# Patient Record
Sex: Male | Born: 1960 | Race: Black or African American | Hispanic: No | Marital: Single | Smoking: Current some day smoker
Health system: Southern US, Community
[De-identification: ages and names within clinical notes are randomized; demographics above are authoritative.]

## PROBLEM LIST (undated history)

## (undated) DIAGNOSIS — Z789 Other specified health status: Secondary | ICD-10-CM

---

## 1999-08-03 ENCOUNTER — Encounter: Payer: Self-pay | Admitting: Emergency Medicine

## 1999-08-03 ENCOUNTER — Emergency Department (HOSPITAL_COMMUNITY): Admission: EM | Admit: 1999-08-03 | Discharge: 1999-08-03 | Payer: Self-pay | Admitting: Emergency Medicine

## 1999-10-16 ENCOUNTER — Emergency Department (HOSPITAL_COMMUNITY): Admission: EM | Admit: 1999-10-16 | Discharge: 1999-10-16 | Payer: Self-pay | Admitting: Emergency Medicine

## 2005-05-26 ENCOUNTER — Emergency Department (HOSPITAL_COMMUNITY): Admission: EM | Admit: 2005-05-26 | Discharge: 2005-05-26 | Payer: Self-pay | Admitting: Family Medicine

## 2009-11-24 ENCOUNTER — Emergency Department (HOSPITAL_COMMUNITY): Admission: EM | Admit: 2009-11-24 | Discharge: 2009-11-24 | Payer: Self-pay | Admitting: Emergency Medicine

## 2009-12-18 ENCOUNTER — Emergency Department (HOSPITAL_COMMUNITY): Admission: EM | Admit: 2009-12-18 | Discharge: 2009-12-18 | Payer: Self-pay | Admitting: Emergency Medicine

## 2010-06-09 LAB — GC/CHLAMYDIA PROBE AMP, GENITAL
Chlamydia, DNA Probe: NEGATIVE
GC Probe Amp, Genital: POSITIVE — AB

## 2010-06-10 LAB — GONOCOCCUS CULTURE: Culture: NO GROWTH

## 2010-06-10 LAB — GRAM STAIN: Gram Stain: NONE SEEN

## 2010-06-10 LAB — EYE CULTURE

## 2011-08-16 ENCOUNTER — Encounter (HOSPITAL_COMMUNITY): Payer: Self-pay | Admitting: Certified Registered"

## 2011-08-16 ENCOUNTER — Inpatient Hospital Stay (HOSPITAL_COMMUNITY)
Admission: EM | Admit: 2011-08-16 | Discharge: 2011-08-22 | DRG: 492 | Disposition: A | Discharge status: Rehabilitation Facility | Payer: No Typology Code available for payment source | Attending: General Surgery | Admitting: General Surgery

## 2011-08-16 ENCOUNTER — Emergency Department (HOSPITAL_COMMUNITY): Payer: No Typology Code available for payment source

## 2011-08-16 ENCOUNTER — Inpatient Hospital Stay (HOSPITAL_COMMUNITY): Payer: No Typology Code available for payment source | Admitting: Certified Registered"

## 2011-08-16 ENCOUNTER — Encounter (HOSPITAL_COMMUNITY): Admission: EM | Disposition: A | Discharge status: Rehabilitation Facility | Payer: Self-pay | Source: Home / Self Care

## 2011-08-16 ENCOUNTER — Inpatient Hospital Stay (HOSPITAL_COMMUNITY): Payer: No Typology Code available for payment source

## 2011-08-16 DIAGNOSIS — S3130XA Unspecified open wound of scrotum and testes, initial encounter: Secondary | ICD-10-CM | POA: Diagnosis present

## 2011-08-16 DIAGNOSIS — Y998 Other external cause status: Secondary | ICD-10-CM

## 2011-08-16 DIAGNOSIS — F1210 Cannabis abuse, uncomplicated: Secondary | ICD-10-CM | POA: Diagnosis present

## 2011-08-16 DIAGNOSIS — S82409A Unspecified fracture of shaft of unspecified fibula, initial encounter for closed fracture: Secondary | ICD-10-CM

## 2011-08-16 DIAGNOSIS — S3121XA Laceration without foreign body of penis, initial encounter: Secondary | ICD-10-CM | POA: Diagnosis present

## 2011-08-16 DIAGNOSIS — Y92410 Unspecified street and highway as the place of occurrence of the external cause: Secondary | ICD-10-CM

## 2011-08-16 DIAGNOSIS — S3120XA Unspecified open wound of penis, initial encounter: Secondary | ICD-10-CM | POA: Diagnosis present

## 2011-08-16 DIAGNOSIS — F17200 Nicotine dependence, unspecified, uncomplicated: Secondary | ICD-10-CM | POA: Diagnosis present

## 2011-08-16 DIAGNOSIS — D62 Acute posthemorrhagic anemia: Secondary | ICD-10-CM | POA: Diagnosis not present

## 2011-08-16 DIAGNOSIS — S32599A Other specified fracture of unspecified pubis, initial encounter for closed fracture: Secondary | ICD-10-CM | POA: Diagnosis present

## 2011-08-16 DIAGNOSIS — S060XAA Concussion with loss of consciousness status unknown, initial encounter: Secondary | ICD-10-CM | POA: Diagnosis present

## 2011-08-16 DIAGNOSIS — R0602 Shortness of breath: Secondary | ICD-10-CM | POA: Diagnosis present

## 2011-08-16 DIAGNOSIS — S8253XA Displaced fracture of medial malleolus of unspecified tibia, initial encounter for closed fracture: Principal | ICD-10-CM | POA: Diagnosis present

## 2011-08-16 DIAGNOSIS — S32509B Unspecified fracture of unspecified pubis, initial encounter for open fracture: Secondary | ICD-10-CM | POA: Diagnosis present

## 2011-08-16 DIAGNOSIS — S82209A Unspecified fracture of shaft of unspecified tibia, initial encounter for closed fracture: Secondary | ICD-10-CM | POA: Diagnosis present

## 2011-08-16 DIAGNOSIS — S82201A Unspecified fracture of shaft of right tibia, initial encounter for closed fracture: Secondary | ICD-10-CM | POA: Diagnosis present

## 2011-08-16 DIAGNOSIS — S3131XA Laceration without foreign body of scrotum and testes, initial encounter: Secondary | ICD-10-CM | POA: Diagnosis present

## 2011-08-16 DIAGNOSIS — S82832A Other fracture of upper and lower end of left fibula, initial encounter for closed fracture: Secondary | ICD-10-CM | POA: Diagnosis present

## 2011-08-16 HISTORY — PX: INCISION AND DRAINAGE OF WOUND: SHX1803

## 2011-08-16 HISTORY — PX: ORIF ANKLE FRACTURE: SHX5408

## 2011-08-16 HISTORY — DX: Other specified health status: Z78.9

## 2011-08-16 HISTORY — PX: TIBIA IM NAIL INSERTION: SHX2516

## 2011-08-16 HISTORY — PX: CYSTOSCOPY: SHX5120

## 2011-08-16 HISTORY — PX: SCROTAL EXPLORATION: SHX2386

## 2011-08-16 HISTORY — PX: LACERATION REPAIR: SHX5284

## 2011-08-16 LAB — ABO/RH: ABO/RH(D): O POS

## 2011-08-16 LAB — POCT I-STAT, CHEM 8
Calcium, Ion: 1.12 mmol/L (ref 1.12–1.32)
Chloride: 107 mEq/L (ref 96–112)
HCT: 40 % (ref 39.0–52.0)
Hemoglobin: 13.6 g/dL (ref 13.0–17.0)

## 2011-08-16 LAB — COMPREHENSIVE METABOLIC PANEL
ALT: 28 U/L (ref 0–53)
AST: 38 U/L — ABNORMAL HIGH (ref 0–37)
Alkaline Phosphatase: 63 U/L (ref 39–117)
CO2: 20 mEq/L (ref 19–32)
Chloride: 100 mEq/L (ref 96–112)
GFR calc non Af Amer: 73 mL/min — ABNORMAL LOW (ref >90)
Sodium: 136 mEq/L (ref 135–145)
Total Bilirubin: 0.4 mg/dL (ref 0.3–1.2)

## 2011-08-16 LAB — CDS SEROLOGY

## 2011-08-16 LAB — CBC
MCH: 28.4 pg (ref 26.0–34.0)
MCHC: 33.7 g/dL (ref 30.0–36.0)
Platelets: 221 10*3/uL (ref 150–400)

## 2011-08-16 LAB — PROTIME-INR: INR: 1.03 (ref 0.00–1.49)

## 2011-08-16 LAB — ETHANOL: Alcohol, Ethyl (B): <11 mg/dL (ref 0–11)

## 2011-08-16 SURGERY — INSERTION, INTRAMEDULLARY ROD, TIBIA
Anesthesia: General | Site: Penis | Laterality: Right | Wound class: Clean Contaminated

## 2011-08-16 MED ORDER — MIDAZOLAM HCL 5 MG/5ML IJ SOLN
INTRAMUSCULAR | Status: DC | PRN
  Administered 2011-08-16: 2 mg via INTRAVENOUS

## 2011-08-16 MED ORDER — EPHEDRINE SULFATE 50 MG/ML IJ SOLN
INTRAMUSCULAR | Status: DC | PRN
  Administered 2011-08-16: 5 mg via INTRAVENOUS
  Administered 2011-08-16 (×2): 10 mg via INTRAVENOUS
  Administered 2011-08-16: 15 mg via INTRAVENOUS

## 2011-08-16 MED ORDER — ROCURONIUM BROMIDE 100 MG/10ML IV SOLN
INTRAVENOUS | Status: DC | PRN
  Administered 2011-08-16: 20 mg via INTRAVENOUS
  Administered 2011-08-16: 30 mg via INTRAVENOUS

## 2011-08-16 MED ORDER — LIDOCAINE HCL (CARDIAC) 20 MG/ML IV SOLN
INTRAVENOUS | Status: DC | PRN
  Administered 2011-08-16: 30 mg via INTRAVENOUS

## 2011-08-16 MED ORDER — SODIUM CHLORIDE 0.9 % IR SOLN
Status: DC | PRN
  Administered 2011-08-16: 22:00:00

## 2011-08-16 MED ORDER — SODIUM CHLORIDE 0.9 % IR SOLN
Status: DC | PRN
  Administered 2011-08-16: 1000 mL

## 2011-08-16 MED ORDER — SODIUM CHLORIDE 0.9 % IV SOLN
INTRAVENOUS | Status: DC | PRN
  Administered 2011-08-16: 23:00:00 via INTRAVENOUS

## 2011-08-16 MED ORDER — SUCCINYLCHOLINE CHLORIDE 20 MG/ML IJ SOLN
INTRAMUSCULAR | Status: DC | PRN
  Administered 2011-08-16: 160 mg via INTRAVENOUS

## 2011-08-16 MED ORDER — TETANUS-DIPHTH-ACELL PERTUSSIS 5-2.5-18.5 LF-MCG/0.5 IM SUSP
0.5000 mL | Freq: Once | INTRAMUSCULAR | Status: AC
  Administered 2011-08-16: 0.5 mL via INTRAMUSCULAR

## 2011-08-16 MED ORDER — CEFAZOLIN SODIUM-DEXTROSE 2-3 GM-% IV SOLR
2.0000 g | Freq: Once | INTRAVENOUS | Status: AC
  Administered 2011-08-16: 2 g via INTRAVENOUS
  Filled 2011-08-16: qty 50

## 2011-08-16 MED ORDER — TETANUS-DIPHTHERIA TOXOIDS TD 5-2 LFU IM INJ: 0.5000 mL | INJECTION | Freq: Once | INTRAMUSCULAR | Status: DC

## 2011-08-16 MED ORDER — FENTANYL CITRATE 0.05 MG/ML IJ SOLN
INTRAMUSCULAR | Status: DC | PRN
  Administered 2011-08-16 (×2): 100 ug via INTRAVENOUS
  Administered 2011-08-16: 50 ug via INTRAVENOUS

## 2011-08-16 MED ORDER — PHENYLEPHRINE HCL 10 MG/ML IJ SOLN
INTRAMUSCULAR | Status: DC | PRN
  Administered 2011-08-16: 120 ug via INTRAVENOUS
  Administered 2011-08-16: 80 ug via INTRAVENOUS
  Administered 2011-08-16: 120 ug via INTRAVENOUS
  Administered 2011-08-16 (×2): 40 ug via INTRAVENOUS

## 2011-08-16 MED ORDER — IOHEXOL 300 MG/ML  SOLN
100.0000 mL | Freq: Once | INTRAMUSCULAR | Status: AC | PRN
  Administered 2011-08-16: 100 mL via INTRAVENOUS

## 2011-08-16 MED ORDER — MORPHINE SULFATE 4 MG/ML IJ SOLN
4.0000 mg | Freq: Once | INTRAMUSCULAR | Status: AC
  Administered 2011-08-16: 4 mg via INTRAVENOUS
  Filled 2011-08-16: qty 1

## 2011-08-16 MED ORDER — TETANUS-DIPHTH-ACELL PERTUSSIS 5-2.5-18.5 LF-MCG/0.5 IM SUSP
INTRAMUSCULAR | Status: AC
  Filled 2011-08-16: qty 0.5

## 2011-08-16 MED ORDER — ONDANSETRON HCL 4 MG/2ML IJ SOLN
4.0000 mg | Freq: Once | INTRAMUSCULAR | Status: AC
  Administered 2011-08-16: 4 mg via INTRAVENOUS
  Filled 2011-08-16: qty 2

## 2011-08-16 MED ORDER — SODIUM CHLORIDE 0.9 % IV BOLUS (SEPSIS)
1000.0000 mL | INTRAVENOUS | Status: AC
  Administered 2011-08-16 (×3): 1000 mL via INTRAVENOUS

## 2011-08-16 MED ORDER — PROPOFOL 10 MG/ML IV EMUL
INTRAVENOUS | Status: DC | PRN
  Administered 2011-08-16: 110 mg via INTRAVENOUS

## 2011-08-16 MED ORDER — SODIUM CHLORIDE 0.9 % IR SOLN
Status: DC | PRN
  Administered 2011-08-16: 3000 mL

## 2011-08-16 MED ORDER — MORPHINE SULFATE 4 MG/ML IJ SOLN
INTRAMUSCULAR | Status: AC
  Administered 2011-08-16: 4 mg
  Filled 2011-08-16: qty 1

## 2011-08-16 MED ORDER — LACTATED RINGERS IV SOLN
INTRAVENOUS | Status: DC | PRN
  Administered 2011-08-16 (×3): via INTRAVENOUS

## 2011-08-16 SURGICAL SUPPLY — 110 items
ADAPTER CATH URET PLST 4-6FR (CATHETERS) IMPLANT
APL SKNCLS STERI-STRIP NONHPOA (GAUZE/BANDAGES/DRESSINGS)
BAG DECANTER FOR FLEXI CONT (MISCELLANEOUS) ×6 IMPLANT
BAG URINE DRAINAGE (UROLOGICAL SUPPLIES) ×6 IMPLANT
BAG URO CATCHER STRL LF (DRAPE) ×6 IMPLANT
BANDAGE ELASTIC 6 VELCRO ST LF (GAUZE/BANDAGES/DRESSINGS) ×6 IMPLANT
BANDAGE ESMARK 6X9 LF (GAUZE/BANDAGES/DRESSINGS) IMPLANT
BANDAGE GAUZE ELAST BULKY 4 IN (GAUZE/BANDAGES/DRESSINGS) ×18 IMPLANT
BENZOIN TINCTURE PRP APPL 2/3 (GAUZE/BANDAGES/DRESSINGS) IMPLANT
BIT DRILL 2.5X2.75 QC CALB (BIT) ×6 IMPLANT
BIT DRILL 3.8X8 NS (BIT) ×6 IMPLANT
BIT DRILL 4.4 NS (BIT) ×6 IMPLANT
BLADE SURG 10 STRL SS (BLADE) ×12 IMPLANT
BLADE SURG 15 STRL LF DISP TIS (BLADE) ×5 IMPLANT
BLADE SURG 15 STRL SS (BLADE) ×3
BLADE SURG ROTATE 9660 (MISCELLANEOUS) IMPLANT
BNDG CMPR 9X6 STRL LF SNTH (GAUZE/BANDAGES/DRESSINGS)
BNDG COHESIVE 4X5 TAN STRL (GAUZE/BANDAGES/DRESSINGS) ×6 IMPLANT
BNDG ESMARK 6X9 LF (GAUZE/BANDAGES/DRESSINGS)
BRUSH SCRUB DISP (MISCELLANEOUS) ×12 IMPLANT
BUCKET BIOHAZARD WASTE 5 GAL (MISCELLANEOUS) ×6 IMPLANT
CATH FOLEY 2WAY SLVR  5CC 16FR (CATHETERS) ×1
CATH FOLEY 2WAY SLVR 5CC 16FR (CATHETERS) ×5 IMPLANT
CATH URET 5FR 28IN CONE TIP (BALLOONS)
CATH URET 5FR 70CM CONE TIP (BALLOONS) IMPLANT
CHLORAPREP W/TINT 26ML (MISCELLANEOUS) ×6 IMPLANT
CLOTH BEACON ORANGE TIMEOUT ST (SAFETY) ×12 IMPLANT
COVER SURGICAL LIGHT HANDLE (MISCELLANEOUS) ×24 IMPLANT
DRAPE C-ARM 42X72 X-RAY (DRAPES) ×12 IMPLANT
DRAPE C-ARMOR (DRAPES) ×6 IMPLANT
DRAPE CAMERA CLOSED 9X96 (DRAPES) ×12 IMPLANT
DRAPE INCISE IOBAN 66X45 STRL (DRAPES) ×6 IMPLANT
DRAPE LAPAROTOMY T 102X78X121 (DRAPES) IMPLANT
DRAPE ORTHO SPLIT 77X108 STRL (DRAPES) ×4
DRAPE PROXIMA HALF (DRAPES) IMPLANT
DRAPE SURG ORHT 6 SPLT 77X108 (DRAPES) ×10 IMPLANT
DRAPE U-SHAPE 47X51 STRL (DRAPES) ×6 IMPLANT
DRSG ADAPTIC 3X8 NADH LF (GAUZE/BANDAGES/DRESSINGS) ×6 IMPLANT
DRSG EMULSION OIL 3X3 NADH (GAUZE/BANDAGES/DRESSINGS) ×6 IMPLANT
DRSG MEPILEX BORDER 4X4 (GAUZE/BANDAGES/DRESSINGS) ×6 IMPLANT
ELECT CAUTERY BLADE 6.4 (BLADE) ×6 IMPLANT
ELECT REM PT RETURN 9FT ADLT (ELECTROSURGICAL) ×12
ELECTRODE REM PT RTRN 9FT ADLT (ELECTROSURGICAL) ×10 IMPLANT
GAUZE SPONGE 4X4 16PLY XRAY LF (GAUZE/BANDAGES/DRESSINGS) ×6 IMPLANT
GAUZE XEROFORM 5X9 LF (GAUZE/BANDAGES/DRESSINGS) ×6 IMPLANT
GLOVE BIO SURGEON STRL SZ7 (GLOVE) ×6 IMPLANT
GLOVE BIO SURGEON STRL SZ7.5 (GLOVE) ×6 IMPLANT
GLOVE BIOGEL M STRL SZ7.5 (GLOVE) ×6 IMPLANT
GLOVE BIOGEL PI IND STRL 8 (GLOVE) ×5 IMPLANT
GLOVE BIOGEL PI INDICATOR 8 (GLOVE) ×1
GOWN PREVENTION PLUS LG XLONG (DISPOSABLE) ×6 IMPLANT
GOWN STRL NON-REIN LRG LVL3 (GOWN DISPOSABLE) ×36 IMPLANT
GUIDEWIRE BALL NOSE 80CM (WIRE) ×6 IMPLANT
GUIDEWIRE COOK  .035 (WIRE) IMPLANT
H R LUBE JELLY XXX (MISCELLANEOUS) ×6 IMPLANT
HANDPIECE INTERPULSE COAX TIP (DISPOSABLE)
KIT BASIN OR (CUSTOM PROCEDURE TRAY) ×12 IMPLANT
KIT ROOM TURNOVER OR (KITS) ×12 IMPLANT
LEGGING LITHOTOMY PAIR STRL (DRAPES) IMPLANT
NAIL TIBIAL 11MM X 37.5CM (Nail) ×5 IMPLANT
NEEDLE HYPO 25X1 1.5 SAFETY (NEEDLE) IMPLANT
NS IRRIG 1000ML POUR BTL (IV SOLUTION) ×6 IMPLANT
PACK CYSTOSCOPY (CUSTOM PROCEDURE TRAY) ×6 IMPLANT
PACK ORTHO EXTREMITY (CUSTOM PROCEDURE TRAY) ×6 IMPLANT
PACK SURGICAL SETUP 50X90 (CUSTOM PROCEDURE TRAY) ×6 IMPLANT
PAD ARMBOARD 7.5X6 YLW CONV (MISCELLANEOUS) ×24 IMPLANT
PADDING CAST ABS 6INX4YD NS (CAST SUPPLIES) ×1
PADDING CAST ABS COTTON 6X4 NS (CAST SUPPLIES) ×5 IMPLANT
PENCIL BUTTON HOLSTER BLD 10FT (ELECTRODE) ×6 IMPLANT
PIN GUIDE ACE (PIN) ×6 IMPLANT
PLATE ACE 100DEG 6HOLE (Plate) ×6 IMPLANT
PLUG CATH AND CAP STER (CATHETERS) IMPLANT
SCREW ACECAP 44MM (Screw) ×6 IMPLANT
SCREW CORTICAL 3.5MM  16MM (Screw) ×3 IMPLANT
SCREW CORTICAL 3.5MM 14MM (Screw) ×6 IMPLANT
SCREW CORTICAL 3.5MM 16MM (Screw) ×15 IMPLANT
SCREW CORTICAL 3.5MM 18MM (Screw) ×6 IMPLANT
SCREW CORTICAL 3.5MM 24MM (Screw) ×6 IMPLANT
SCREW PROXIMAL DEPUY (Screw) ×4 IMPLANT
SCREW PRXML FT 55X5.5XNS TIB (Screw) ×5 IMPLANT
SET HNDPC FAN SPRY TIP SCT (DISPOSABLE) IMPLANT
SPLINT PLASTER CAST XFAST 5X30 (CAST SUPPLIES) ×5 IMPLANT
SPLINT PLASTER XFAST SET 5X30 (CAST SUPPLIES) ×1
SPONGE GAUZE 4X4 12PLY (GAUZE/BANDAGES/DRESSINGS) ×18 IMPLANT
SPONGE LAP 4X18 X RAY DECT (DISPOSABLE) IMPLANT
STAPLER VISISTAT (STAPLE) ×6 IMPLANT
STAPLER VISISTAT 35W (STAPLE) ×6 IMPLANT
STENT URET 6FRX24 CONTOUR (STENTS) IMPLANT
SUT CHROMIC 3 0 PS 2 (SUTURE) IMPLANT
SUT CHROMIC 3 0 SH 27 (SUTURE) ×12 IMPLANT
SUT CHROMIC 4 0 RB 1X27 (SUTURE) ×12 IMPLANT
SUT PROLENE 3 0 PS 2 (SUTURE) IMPLANT
SUT VIC AB 0 CT1 27 (SUTURE)
SUT VIC AB 0 CT1 27XBRD ANBCTR (SUTURE) IMPLANT
SUT VIC AB 2-0 CT1 27 (SUTURE) ×8
SUT VIC AB 2-0 CT1 TAPERPNT 27 (SUTURE) ×10 IMPLANT
SUT VIC AB 2-0 CT3 27 (SUTURE) IMPLANT
SUT VIC AB 2-0 FS1 27 (SUTURE) ×6 IMPLANT
SUT VIC AB 3-0 SH 18 (SUTURE) ×6 IMPLANT
SYR CONTROL 10ML LL (SYRINGE) ×6 IMPLANT
SYRINGE TOOMEY DISP (SYRINGE) IMPLANT
TIBIAL NAIL 11MM X 37.5CM (Nail) ×6 IMPLANT
TOWEL OR 17X24 6PK STRL BLUE (TOWEL DISPOSABLE) ×12 IMPLANT
TOWEL OR 17X26 10 PK STRL BLUE (TOWEL DISPOSABLE) ×12 IMPLANT
TRAY FOLEY CATH 14FR (SET/KITS/TRAYS/PACK) ×6 IMPLANT
TUBE CONNECTING 12X1/4 (SUCTIONS) ×12 IMPLANT
UNDERPAD 30X30 INCONTINENT (UNDERPADS AND DIAPERS) ×6 IMPLANT
WATER STERILE IRR 1000ML POUR (IV SOLUTION) ×6 IMPLANT
WIRE COONS/BENSON .038X145CM (WIRE) IMPLANT
YANKAUER SUCT BULB TIP NO VENT (SUCTIONS) ×12 IMPLANT

## 2011-08-16 NOTE — ED Notes (Signed)
While in CT pt having lg amount of bleeding coming from penis and scrotum sac.  Dr. Radford Pax called to CT.  Quick Clot applied to site.   Also c/o point tenderness to spine.
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
## 2011-08-16 NOTE — ED Notes (Signed)
Spoke with pt's brother, st's he will come to hospital.
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
## 2011-08-16 NOTE — ED Notes (Signed)
Urologist in to assess pt.

## 2011-08-16 NOTE — Preoperative (Addendum)
Beta Blockers   Reason not to administer Beta Blockers:Not Applicable 
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
## 2011-08-16 NOTE — ED Notes (Signed)
Pt continues to bleed from scrotum, MD made aware.  OR called st's ready for pt.
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
## 2011-08-16 NOTE — Progress Notes (Signed)
Orthopedic Tech Progress Note Patient Details:  Xavier Matthews [birth year] [id number]  Type of Splint: Short Leg;Stirrup Splint Location: (R) LE Splint Interventions: Application    Jennye Moccasin 08/16/2011, 8:11 PM
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
## 2011-08-16 NOTE — ED Notes (Signed)
To CT via stretcher with monitor.
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
## 2011-08-16 NOTE — Anesthesia Preprocedure Evaluation (Addendum)
Anesthesia Evaluation  Patient identified by MRN, date of birth, ID band Patient confused    Reviewed: Allergy & Precautions, H&P , NPO status , Patient's Chart, lab work & pertinent test results, Unable to perform ROS - Chart review only  Airway Mallampati: II TM Distance: >3 FB Neck ROM: Limited    Dental  (+) Poor Dentition, Dental Advisory Given and Teeth Intact   Pulmonary Current Smoker,  breath sounds clear to auscultation  Pulmonary exam normal       Cardiovascular Rhythm:Regular Rate:Normal  Hypotensive in ED, received fluid bolus   Neuro/Psych  C-spine not cleared negative psych ROS   GI/Hepatic negative GI ROS, Neg liver ROS,   Endo/Other  negative endocrine ROS  Renal/GU negative Renal ROS   Genitalia injury tonight, probable arterial injury    Musculoskeletal   Abdominal   Peds  Hematology negative hematology ROS (+)   Anesthesia Other Findings   Reproductive/Obstetrics                          Anesthesia Physical Anesthesia Plan  ASA: II and Emergent  Anesthesia Plan: General   Post-op Pain Management:    Induction: Intravenous and Rapid sequence  Airway Management Planned: Oral ETT and Video Laryngoscope Planned  Additional Equipment:   Intra-op Plan:   Post-operative Plan: Extubation in OR  Informed Consent: I have reviewed the patients History and Physical, chart, labs and discussed the procedure including the risks, benefits and alternatives for the proposed anesthesia with the patient or authorized representative who has indicated his/her understanding and acceptance.   Dental advisory given and Only emergency history available  Plan Discussed with: Surgeon and CRNA  Anesthesia Plan Comments: (Plan routine monitors, RSI GETA with C-collar in place)        Anesthesia Quick Evaluation

## 2011-08-17 ENCOUNTER — Inpatient Hospital Stay (HOSPITAL_COMMUNITY): Payer: No Typology Code available for payment source

## 2011-08-17 ENCOUNTER — Encounter (HOSPITAL_COMMUNITY): Payer: Self-pay | Admitting: Orthopedic Surgery

## 2011-08-17 DIAGNOSIS — S3121XA Laceration without foreign body of penis, initial encounter: Secondary | ICD-10-CM | POA: Diagnosis present

## 2011-08-17 DIAGNOSIS — D62 Acute posthemorrhagic anemia: Secondary | ICD-10-CM | POA: Diagnosis not present

## 2011-08-17 DIAGNOSIS — S82832A Other fracture of upper and lower end of left fibula, initial encounter for closed fracture: Secondary | ICD-10-CM | POA: Diagnosis present

## 2011-08-17 DIAGNOSIS — S32599A Other specified fracture of unspecified pubis, initial encounter for closed fracture: Secondary | ICD-10-CM | POA: Diagnosis present

## 2011-08-17 DIAGNOSIS — S82201A Unspecified fracture of shaft of right tibia, initial encounter for closed fracture: Secondary | ICD-10-CM | POA: Diagnosis present

## 2011-08-17 DIAGNOSIS — S060XAA Concussion with loss of consciousness status unknown, initial encounter: Secondary | ICD-10-CM | POA: Diagnosis present

## 2011-08-17 DIAGNOSIS — S3131XA Laceration without foreign body of scrotum and testes, initial encounter: Secondary | ICD-10-CM | POA: Diagnosis present

## 2011-08-17 LAB — POCT I-STAT 7, (LYTES, BLD GAS, ICA,H+H)
Acid-base deficit: 5 mmol/L — ABNORMAL HIGH (ref 0.0–2.0)
Bicarbonate: 21.6 meq/L (ref 20.0–24.0)
HCT: 27 % — ABNORMAL LOW (ref 39.0–52.0)
Patient temperature: 35.7
pCO2 arterial: 44.7 mmHg (ref 35.0–45.0)
pO2, Arterial: 522 mmHg — ABNORMAL HIGH (ref 80.0–100.0)

## 2011-08-17 LAB — BASIC METABOLIC PANEL
Calcium: 7.6 mg/dL — ABNORMAL LOW (ref 8.4–10.5)
GFR calc Af Amer: >90 mL/min (ref >90)
GFR calc non Af Amer: >90 mL/min (ref >90)
Glucose, Bld: 116 mg/dL — ABNORMAL HIGH (ref 70–99)
Potassium: 3.6 mEq/L (ref 3.5–5.1)
Sodium: 140 mEq/L (ref 135–145)

## 2011-08-17 LAB — URINALYSIS, MICROSCOPIC ONLY
Glucose, UA: NEGATIVE mg/dL
Ketones, ur: NEGATIVE mg/dL
Leukocytes, UA: NEGATIVE
Nitrite: NEGATIVE
Protein, ur: NEGATIVE mg/dL

## 2011-08-17 LAB — POCT I-STAT 4, (NA,K, GLUC, HGB,HCT)
Hemoglobin: 7.8 g/dL — ABNORMAL LOW (ref 13.0–17.0)
Sodium: 141 meq/L (ref 135–145)

## 2011-08-17 LAB — HEMOGLOBIN AND HEMATOCRIT, BLOOD: HCT: 32.7 % — ABNORMAL LOW (ref 39.0–52.0)

## 2011-08-17 LAB — CBC
Hemoglobin: 11.3 g/dL — ABNORMAL LOW (ref 13.0–17.0)
MCHC: 33.6 g/dL (ref 30.0–36.0)
Platelets: 116 10*3/uL — ABNORMAL LOW (ref 150–400)
RDW: 13.8 % (ref 11.5–15.5)

## 2011-08-17 LAB — PROTIME-INR
INR: 1.2 (ref 0.00–1.49)
Prothrombin Time: 15.5 seconds — ABNORMAL HIGH (ref 11.6–15.2)

## 2011-08-17 LAB — MRSA PCR SCREENING: MRSA by PCR: NEGATIVE

## 2011-08-17 MED ORDER — GLYCOPYRROLATE 0.2 MG/ML IJ SOLN
INTRAMUSCULAR | Status: DC | PRN
  Administered 2011-08-16: 0.4 mg via INTRAVENOUS

## 2011-08-17 MED ORDER — NEOSTIGMINE METHYLSULFATE 1 MG/ML IJ SOLN
INTRAMUSCULAR | Status: DC | PRN
  Administered 2011-08-16: 3 mg via INTRAVENOUS

## 2011-08-17 MED ORDER — ONDANSETRON HCL 4 MG/2ML IJ SOLN: 4.0000 mg | Freq: Four times a day (QID) | INTRAMUSCULAR | Status: DC | PRN

## 2011-08-17 MED ORDER — MEPERIDINE HCL 25 MG/ML IJ SOLN: 6.2500 mg | INTRAMUSCULAR | Status: DC | PRN

## 2011-08-17 MED ORDER — ENOXAPARIN SODIUM 30 MG/0.3ML ~~LOC~~ SOLN
30.0000 mg | Freq: Two times a day (BID) | SUBCUTANEOUS | Status: DC
  Administered 2011-08-17 – 2011-08-22 (×11): 30 mg via SUBCUTANEOUS
  Filled 2011-08-17 (×14): qty 0.3

## 2011-08-17 MED ORDER — KCL IN DEXTROSE-NACL 20-5-0.45 MEQ/L-%-% IV SOLN
INTRAVENOUS | Status: DC
  Administered 2011-08-17 – 2011-08-18 (×3): via INTRAVENOUS
  Filled 2011-08-17 (×6): qty 1000

## 2011-08-17 MED ORDER — HYDROMORPHONE HCL PF 1 MG/ML IJ SOLN
0.2500 mg | INTRAMUSCULAR | Status: DC | PRN
  Administered 2011-08-17 (×4): 0.5 mg via INTRAVENOUS

## 2011-08-17 MED ORDER — PANTOPRAZOLE SODIUM 40 MG PO TBEC: 40.0000 mg | DELAYED_RELEASE_TABLET | Freq: Every day | ORAL | Status: DC

## 2011-08-17 MED ORDER — HYDROMORPHONE HCL PF 1 MG/ML IJ SOLN
1.0000 mg | INTRAMUSCULAR | Status: DC | PRN
  Administered 2011-08-17 (×2): 2 mg via INTRAVENOUS
  Administered 2011-08-17 (×3): 1 mg via INTRAVENOUS
  Administered 2011-08-18 (×3): 2 mg via INTRAVENOUS
  Filled 2011-08-17: qty 2
  Filled 2011-08-17: qty 1
  Filled 2011-08-17 (×3): qty 2
  Filled 2011-08-17: qty 1
  Filled 2011-08-17: qty 2
  Filled 2011-08-17 (×2): qty 1

## 2011-08-17 MED ORDER — PANTOPRAZOLE SODIUM 40 MG IV SOLR
40.0000 mg | Freq: Every day | INTRAVENOUS | Status: DC
  Administered 2011-08-17: 40 mg via INTRAVENOUS
  Filled 2011-08-17 (×2): qty 40

## 2011-08-17 MED ORDER — HYDROMORPHONE HCL PF 1 MG/ML IJ SOLN
0.5000 mg | INTRAMUSCULAR | Status: DC | PRN
  Administered 2011-08-17 (×2): 0.5 mg via INTRAVENOUS

## 2011-08-17 MED ORDER — ACETAMINOPHEN 325 MG PO TABS
650.0000 mg | ORAL_TABLET | Freq: Four times a day (QID) | ORAL | Status: DC | PRN
  Administered 2011-08-18 – 2011-08-19 (×2): 650 mg via ORAL
  Filled 2011-08-17 (×2): qty 2

## 2011-08-17 MED ORDER — CEFAZOLIN SODIUM 1-5 GM-% IV SOLN: 1.0000 g | INTRAVENOUS | Status: DC

## 2011-08-17 MED ORDER — OXYCODONE HCL 5 MG PO TABS
5.0000 mg | ORAL_TABLET | ORAL | Status: DC | PRN
  Administered 2011-08-17 (×3): 10 mg via ORAL
  Filled 2011-08-17 (×3): qty 2

## 2011-08-17 MED ORDER — ONDANSETRON HCL 4 MG PO TABS: 4.0000 mg | ORAL_TABLET | Freq: Four times a day (QID) | ORAL | Status: DC | PRN

## 2011-08-17 MED ORDER — PROMETHAZINE HCL 25 MG/ML IJ SOLN: 6.2500 mg | INTRAMUSCULAR | Status: DC | PRN

## 2011-08-17 MED ORDER — MIDAZOLAM HCL 2 MG/2ML IJ SOLN: 0.5000 mg | Freq: Once | INTRAMUSCULAR | Status: DC | PRN

## 2011-08-17 MED ORDER — CEFAZOLIN SODIUM 1-5 GM-% IV SOLN
1.0000 g | Freq: Three times a day (TID) | INTRAVENOUS | Status: AC
  Administered 2011-08-17 – 2011-08-18 (×6): 1 g via INTRAVENOUS
  Filled 2011-08-17 (×7): qty 50

## 2011-08-17 MED ORDER — HYDROMORPHONE HCL PF 1 MG/ML IJ SOLN
INTRAMUSCULAR | Status: AC
  Filled 2011-08-17: qty 1

## 2011-08-17 MED ORDER — BIOTENE DRY MOUTH MT LIQD
15.0000 mL | Freq: Two times a day (BID) | OROMUCOSAL | Status: DC
  Administered 2011-08-17 – 2011-08-21 (×9): 15 mL via OROMUCOSAL

## 2011-08-17 NOTE — Progress Notes (Signed)
PATIENT ID: Xavier Matthews  MRN: [id number]  DOB/AGE:  [birth year] / 51 y.o.  1 Day Post-Op Procedure(s) (LRB): INTRAMEDULLARY (IM) NAIL TIBIAL (Right) CYSTOSCOPY (N/A) SCROTUM EXPLORATION (N/A) OPEN REDUCTION INTERNAL FIXATION (ORIF) ANKLE FRACTURE (Left) IRRIGATION AND DEBRIDEMENT WOUND (N/A) REPAIR MULTIPLE LACERATIONS ()  Subjective: Pain is moderate.  No c/o chest pain or SOB.   Main c/o is pelvic pain.  Also with new c/o L elbow pain   Objective: Vital signs in last 24 hours: Temp:  [96.4 F (35.8 C)-98.4 F (36.9 C)] 98.4 F (36.9 C) (05/23 0400) Pulse Rate:  [76-88] 80  (05/23 0700) Resp:  [9-28] 11  (05/23 0700) BP: (79-142)/(50-89) 111/70 mmHg (05/23 0700) SpO2:  [99 %-100 %] 100 % (05/23 0700) Arterial Line BP: (134-157)/(56-68) 140/65 mmHg (05/23 0700) Weight:  [74.1 kg (163 lb 5.8 oz)] 74.1 kg (163 lb 5.8 oz) (05/23 0600)  Intake/Output from previous day: 05/22 0701 - 05/23 0700 In: 3600 [I.V.:3550; IV Piggyback:50] Out: 1385 [Urine:1185; Blood:200] Intake/Output this shift:     Basename 08/17/11 0400 08/16/11 2331 08/16/11 2222 08/16/11 1737 08/16/11 1733  HGB 11.3* 9.2* 7.8* 13.6 13.0    Basename 08/17/11 0400 08/16/11 2331 08/16/11 1733  WBC 8.8 -- 10.5  RBC 3.96* -- 4.57  HCT 33.6* 27.0* --  PLT 116* -- 221    Basename 08/17/11 0400 08/16/11 2331 08/16/11 2222 08/16/11 1737 08/16/11 1733  NA 140 141 -- -- --  K 3.6 3.7 -- -- --  CL 108 -- -- 107 --  CO2 22 -- -- -- 20  BUN 12 -- -- 15 --  CREATININE 0.96 -- -- 1.20 --  GLUCOSE 116* -- 138* -- --  CALCIUM 7.6* -- -- -- 9.2    Basename 08/17/11 0400 08/16/11 1733  LABPT -- --  INR 1.20 1.03    Physical Exam: RLE comp soft, dressing intact.  Small fx blister anterior at site of fx.  Toes and ankle DF PF. LLE splint intact, wiggles toes, NSTLT LUE elbow TTP no sig swelling, skin intact BUEs NVID.  Assessment/Plan: 1 Day Post-Op Procedure(s) (LRB): INTRAMEDULLARY (IM) NAIL TIBIAL  (Right) CYSTOSCOPY (N/A) SCROTUM EXPLORATION (N/A) OPEN REDUCTION INTERNAL FIXATION (ORIF) ANKLE FRACTURE (Left) IRRIGATION AND DEBRIDEMENT WOUND (N/A) REPAIR MULTIPLE LACERATIONS ()   WBAT RLE, TDWB LLE XR LUE Lovenox per primary Spine clearance per trauma Abx for open ramus fxs, no futher tx will be necessary.   Perineal wounds per Dr. Laverle Patter.   Xavier Matthews 08/17/2011, 8:21 AM

## 2011-08-17 NOTE — Transfer of Care (Signed)
Immediate Anesthesia Transfer of Care Note  Patient: Xavier Matthews  Procedure(s) Performed: Procedure(s) (LRB): INTRAMEDULLARY (IM) NAIL TIBIAL (Right) CYSTOSCOPY (N/A) SCROTUM EXPLORATION (N/A) OPEN REDUCTION INTERNAL FIXATION (ORIF) ANKLE FRACTURE (Left) IRRIGATION AND DEBRIDEMENT WOUND (N/A) REPAIR MULTIPLE LACERATIONS ()  Patient Location: PACU  Anesthesia Type: General  Level of Consciousness: oriented, sedated, patient cooperative and responds to stimulation  Airway & Oxygen Therapy: Patient Spontanous Breathing and Patient connected to nasal cannula oxygen  Post-op Assessment: Report given to PACU RN, Post -op Vital signs reviewed and stable and Patient moving all extremities  Post vital signs: Reviewed and stable  Complications: No apparent anesthesia complications

## 2011-08-17 NOTE — Anesthesia Postprocedure Evaluation (Signed)
  Anesthesia Post-op Note  Patient: Xavier Matthews  Procedure(s) Performed: Procedure(s) (LRB): INTRAMEDULLARY (IM) NAIL TIBIAL (Right) CYSTOSCOPY (N/A) SCROTUM EXPLORATION (N/A) OPEN REDUCTION INTERNAL FIXATION (ORIF) ANKLE FRACTURE (Left) IRRIGATION AND DEBRIDEMENT WOUND (N/A) REPAIR MULTIPLE LACERATIONS ()  Patient Location: PACU  Anesthesia Type: General  Level of Consciousness: sedated  Airway and Oxygen Therapy: Patient Spontanous Breathing and Patient connected to nasal cannula oxygen  Post-op Pain: mild  Post-op Assessment: Post-op Vital signs reviewed, Patient's Cardiovascular Status Stable, Respiratory Function Stable, Patent Airway, No signs of Nausea or vomiting, Adequate PO intake and Pain level not controlled  Post-op Vital Signs: Reviewed and stable  Complications: No apparent anesthesia complications

## 2011-08-17 NOTE — Plan of Care (Signed)
Problem: Phase I Progression Outcomes Goal: OOB as tolerated unless otherwise ordered Outcome: Not Met (add Reason) Awaiting PT eval Goal: Incision/dressings dry and intact Outcome: Not Met (add Reason) Serosanguinous drainage from scrotal wound and bloody drainage to right ankle.

## 2011-08-17 NOTE — Progress Notes (Signed)
Patient ID: Xavier Matthews, male   DOB: 02-26-1961, 51 y.o.   MRN: [id number]  1 Day Post-Op Subjective:  Pt stable overnight.   Objective: Vital signs in last 24 hours: Temp:  [96.4 F (35.8 C)-98.4 F (36.9 C)] 98.4 F (36.9 C) (05/23 0400) Pulse Rate:  [76-88] 81  (05/23 0900) Resp:  [9-28] 14  (05/23 0900) BP: (79-142)/(50-89) 111/69 mmHg (05/23 0900) SpO2:  [99 %-100 %] 99 % (05/23 0900) Arterial Line BP: (123-157)/(56-68) 123/60 mmHg (05/23 0900) Weight:  [74.1 kg (163 lb 5.8 oz)] 74.1 kg (163 lb 5.8 oz) (05/23 0600)  Intake/Output from previous day: 05/22 0701 - 05/23 0700 In: 3600 [I.V.:3550; IV Piggyback:50] Out: 1385 [Urine:1185; Blood:200] Intake/Output this shift: Total I/O In: 250 [I.V.:250] Out: 250 [Urine:250]  Physical Exam:  General: Alert and oriented GU: Penile wounds examined and healing well. Scrotum is closed. Remaining open suprapubic wound is open with exposed pubis. This is currently packed.  Lab Results:  Basename 08/17/11 0400 08/16/11 2331 08/16/11 2222  HGB 11.3* 9.2* 7.8*  HCT 33.6* 27.0* 23.0*   BMET  Basename 08/17/11 0400 08/16/11 2331 08/16/11 2222 08/16/11 1737 08/16/11 1733  NA 140 141 -- -- --  K 3.6 3.7 -- -- --  CL 108 -- -- 107 --  CO2 22 -- -- -- 20  GLUCOSE 116* -- 138* -- --  BUN 12 -- -- 15 --  CREATININE 0.96 -- -- 1.20 --  CALCIUM 7.6* -- -- -- 9.2     Studies/Results:   Assessment/Plan: -- Coband removed from penis.  Leave xeroform for 24 more hours.  -- Patient has open wound in suprapubic region. On exploration last night, there appears to be no damage to the scrotal contents/spermatic cord, urethra, or penis. He does have exposed pubic fracture.  Will leave further management of this wound to the discretion of orthopedics/trauma surgery. -- Will have patient followup as an outpatient following recovery in hospital.   LOS: 1 day   Hyun Marsalis,LES 08/17/2011, 10:25 AM

## 2011-08-17 NOTE — Plan of Care (Signed)
Problem: Phase I Progression Outcomes Goal: Voiding-avoid urinary catheter unless indicated Outcome: Not Progressing Perineal trauma requiring urinary catheter
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
## 2011-08-18 ENCOUNTER — Encounter (HOSPITAL_COMMUNITY): Payer: Self-pay | Admitting: Physical Medicine and Rehabilitation

## 2011-08-18 DIAGNOSIS — S82109A Unspecified fracture of upper end of unspecified tibia, initial encounter for closed fracture: Secondary | ICD-10-CM

## 2011-08-18 DIAGNOSIS — S329XXA Fracture of unspecified parts of lumbosacral spine and pelvis, initial encounter for closed fracture: Secondary | ICD-10-CM

## 2011-08-18 LAB — CBC
HCT: 29.6 % — ABNORMAL LOW (ref 39.0–52.0)
MCV: 84.6 fL (ref 78.0–100.0)
Platelets: 109 10*3/uL — ABNORMAL LOW (ref 150–400)
RBC: 3.5 MIL/uL — ABNORMAL LOW (ref 4.22–5.81)
WBC: 10 10*3/uL (ref 4.0–10.5)

## 2011-08-18 MED ORDER — HYDROMORPHONE HCL PF 1 MG/ML IJ SOLN
1.0000 mg | INTRAMUSCULAR | Status: DC | PRN
  Administered 2011-08-18 – 2011-08-21 (×3): 1 mg via INTRAVENOUS
  Filled 2011-08-18 (×4): qty 1

## 2011-08-18 MED ORDER — BETHANECHOL CHLORIDE 10 MG PO TABS
10.0000 mg | ORAL_TABLET | ORAL | Status: AC
  Administered 2011-08-18 (×5): 10 mg via ORAL
  Filled 2011-08-18 (×5): qty 1

## 2011-08-18 MED ORDER — DIPHENHYDRAMINE HCL 25 MG PO CAPS
25.0000 mg | ORAL_CAPSULE | Freq: Four times a day (QID) | ORAL | Status: DC | PRN
  Administered 2011-08-19 – 2011-08-20 (×2): 25 mg via ORAL
  Filled 2011-08-18 (×2): qty 1

## 2011-08-18 MED ORDER — TRAMADOL HCL 50 MG PO TABS
100.0000 mg | ORAL_TABLET | Freq: Four times a day (QID) | ORAL | Status: DC
  Administered 2011-08-18 – 2011-08-22 (×16): 100 mg via ORAL
  Filled 2011-08-18 (×14): qty 2
  Filled 2011-08-18: qty 1
  Filled 2011-08-18: qty 2
  Filled 2011-08-18: qty 1

## 2011-08-18 MED ORDER — OXYCODONE HCL 5 MG PO TABS
5.0000 mg | ORAL_TABLET | ORAL | Status: DC | PRN
  Administered 2011-08-18 (×3): 15 mg via ORAL
  Administered 2011-08-19 (×3): 10 mg via ORAL
  Administered 2011-08-20: 15 mg via ORAL
  Administered 2011-08-20: 10 mg via ORAL
  Administered 2011-08-20: 15 mg via ORAL
  Administered 2011-08-20: 10 mg via ORAL
  Administered 2011-08-21: 15 mg via ORAL
  Administered 2011-08-21 – 2011-08-22 (×2): 10 mg via ORAL
  Administered 2011-08-22: 5 mg via ORAL
  Administered 2011-08-22: 15 mg via ORAL
  Filled 2011-08-18: qty 2
  Filled 2011-08-18 (×3): qty 3
  Filled 2011-08-18 (×3): qty 2
  Filled 2011-08-18 (×2): qty 3
  Filled 2011-08-18: qty 2
  Filled 2011-08-18: qty 3
  Filled 2011-08-18 (×2): qty 2
  Filled 2011-08-18: qty 3
  Filled 2011-08-18: qty 2

## 2011-08-18 MED ORDER — HYDROMORPHONE HCL PF 1 MG/ML IJ SOLN
1.0000 mg | Freq: Once | INTRAMUSCULAR | Status: AC
  Administered 2011-08-18: 1 mg via INTRAVENOUS
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
## 2011-08-18 NOTE — Consult Note (Signed)
WOC consult Note Reason for Consult: Consult requested for left scrotal wound by trauma team,  PA at bedside to assess site and assist with application of negative pressure device.   Wound type: Full thickness post-surgical debridement. Pressure Ulcer POA: No Measurement:3.5X2X6cm, tunnels upward towards 6 o'clock. Wound bed: Difficult to visualize inner wound bed R/T narrow opening, appears dark red. Drainage (amount, consistency, odor)  Mod old bloody drainage, no odor, very painful to touch. Periwound: Skin intact surrounding, difficult to maintain seal R/T location near penis and inner groin. Dressing procedure/placement/frequency: Barrier ring applied to maintain seal.   White foam applied to inner wound, black foam mushroom pad applied, then track pad to con't suction.  Pt medicated prior to procedure and tolerated with mod discomfort. Plan for dressing changes Q M/W/F   Cammie Mcgee, RN, MSN, [employer]  [phone]
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
## 2011-08-18 NOTE — Progress Notes (Signed)
OT Cancellation Note  Evaluation cancelled today due to pt. very lethargic receiving pain meds after wound consult and P.T. and per family will reattempt at later time.Marland Kitchen  Shinichi Anguiano, OTR/L Pager [phone] 08/18/2011, 2:41 PM

## 2011-08-18 NOTE — Progress Notes (Signed)
Removed packing from the left groin area.  Pooled old blood at the bottom of the wound.  Will see if able to put strip of white VAC foam at the bottom of this wound and use negative pressure technique.  This patient has been seen and I agree with the findings and treatment plan.  Marta Lamas. Gae Bon, MD, FACS [phone] (pager) [phone] (direct pager) Trauma Surgeon

## 2011-08-18 NOTE — ED Provider Notes (Signed)
I saw and evaluated the patient, reviewed the resident's note and I agree with the findings and plan.     Nana Hoselton L Teandra Harlan, MD 08/18/11 1039 

## 2011-08-19 LAB — CBC
HCT: 27.2 % — ABNORMAL LOW (ref 39.0–52.0)
Hemoglobin: 9.4 g/dL — ABNORMAL LOW (ref 13.0–17.0)
MCHC: 34.6 g/dL (ref 30.0–36.0)
RBC: 3.25 MIL/uL — ABNORMAL LOW (ref 4.22–5.81)
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
## 2011-08-19 NOTE — Progress Notes (Signed)
S:  Patient reports pain is well-controlled this morning.  Denies N/V.  O: Afebrile.  Left lower extremity dressing clean.  Splint intact.  Right leg proxima dressing clean, distal dressing partially soaked.  NV intact.  A: s/p ORIF left ankle fx, s/p ORIF right tibial fx  WBAT L ankle - will transition to boot in a few days.  TDWB right leg.

## 2011-08-19 NOTE — Progress Notes (Signed)
OT Note:  Attempted eval.  Pt was in too much pain earlier and is now sleeping deeply from pain medicine.  Will reattempt tomorrow.  [place], [place] [phone] 08/19/2011

## 2011-08-20 LAB — TYPE AND SCREEN
ABO/RH(D): O POS
Antibody Screen: NEGATIVE
Unit division: 0
Unit division: 0
Unit division: 0
Unit division: 0
Unit division: 0
Unit division: 0

## 2011-08-20 NOTE — Progress Notes (Signed)
Patient is resting comfortably this morning.  Left ankle dressing clean, splint intact.  Right knee dressing clean, moderate drainage to distal dressing.  A:  S/p ORIF left ankle fx, s/p ORIF right tibia fx  Change right distal dressing today.  Continue pain meds.
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
## 2011-08-21 ENCOUNTER — Encounter (HOSPITAL_COMMUNITY): Payer: Self-pay | Admitting: General Practice

## 2011-08-21 LAB — CBC
HCT: 25 % — ABNORMAL LOW (ref 39.0–52.0)
MCHC: 34.4 g/dL (ref 30.0–36.0)
RDW: 12.7 % (ref 11.5–15.5)

## 2011-08-21 MED ORDER — POLYETHYLENE GLYCOL 3350 17 G PO PACK
17.0000 g | PACK | Freq: Once | ORAL | Status: AC
  Administered 2011-08-21: 17 g via ORAL
  Filled 2011-08-21: qty 1

## 2011-08-21 MED ORDER — DOCUSATE SODIUM 100 MG PO CAPS
100.0000 mg | ORAL_CAPSULE | Freq: Two times a day (BID) | ORAL | Status: DC
  Administered 2011-08-21 – 2011-08-22 (×3): 100 mg via ORAL
  Filled 2011-08-21 (×4): qty 1
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
## 2011-08-21 NOTE — Progress Notes (Signed)
Patient ID: Xavier Matthews, male   DOB: May 23, 1960, 51 y.o.   MRN: [id number] 5 Days Post-Op Subjective: Patient reports no real issues. Patient is voiding status post removal of his Foley catheter. The patient's suprapubic incision is currently exposed. Wound services are waiting for an additional piece to utilize a wound VAC on this area. Good granulation tissue noted without evidence of ongoing infection necrosis/purulent material.  Objective: Vital signs in last 24 hours: Temp:  [99.6 F (37.6 C)-100 F (37.8 C)] 100 F (37.8 C) (05/27 0521) Pulse Rate:  [91-108] 91  (05/27 0521) Resp:  [16-20] 20  (05/27 0521) BP: (116-129)/(73-84) 116/73 mmHg (05/27 0521) SpO2:  [90 %-93 %] 93 % (05/27 0521)  Intake/Output from previous day: 05/26 0701 - 05/27 0700 In: 1380 [P.O.:1380] Out: 4600 [Urine:4600] Intake/Output this shift: Total I/O In: 240 [P.O.:240] Out: -   Physical Exam:  Constitutional: Vital signs reviewed. WD WN in NAD   See above   Lab Results:  Basename 08/21/11 0500 08/19/11 0530  HGB 8.6* 9.4*  HCT 25.0* 27.2*   BMET No results found for this basename: NA:2,K:2,CL:2,CO2:2,GLUCOSE:2,BUN:2,CREATININE:2,CALCIUM:2 in the last 72 hours No results found for this basename: LABPT:3,INR:3 in the last 72 hours No results found for this basename: LABURIN:1 in the last 72 hours Results for orders placed during the hospital encounter of 08/16/11  MRSA PCR SCREENING     Status: Normal   Collection Time   08/17/11  1:43 AM      Component Value Range Status Comment   MRSA by PCR NEGATIVE  NEGATIVE  Final     Studies/Results: No results found.  Assessment/Plan:   Doing well urologically. Patient will follow up with Dr. Laverle Patter is an outpatient. We can be reconsult as an inpatient if necessary.   LOS: 5 days   Kateryna Grantham S 08/21/2011, 1:44 PM
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
## 2011-08-21 NOTE — Progress Notes (Signed)
Physical Therapy Treatment Patient Details Name: Xavier Matthews MRN: [id number] DOB: 1961/01/28 Today's Date: 08/21/2011 Time: 4098-1191 PT Time Calculation (min): 21 min  PT Assessment / Plan / Recommendation Comments on Treatment Session  Pt making good progress with PT goals + mobility.  Pain continues to be limiting factor.  Pt very motivated.  Pt positioned in recliner at end of session & then RN requested back to bed for VAC change.  RN present for transfer back to bed so she will be able to (A) pt with OOB>recliner when pt desires.  **Goals will need to be updated if pt does not transfer to CIR before next PT session**    Follow Up Recommendations  Inpatient Rehab    Barriers to Discharge        Equipment Recommendations  Defer to next venue    Recommendations for Other Services Rehab consult  Frequency Min 5X/week   Plan Discharge plan remains appropriate    Precautions / Restrictions Precautions Precautions: Fall Restrictions Weight Bearing Restrictions: Yes RLE Weight Bearing: Weight bearing as tolerated LLE Weight Bearing: Touchdown weight bearing       Mobility  Bed Mobility Bed Mobility: Supine to Sit;Sit to Supine;Sitting - Scoot to Edge of Bed Supine to Sit: HOB elevated;4: Min assist Sitting - Scoot to [place] of Bed: 4: Min guard Sit to Supine: 4: Min assist;HOB flat Details for Bed Mobility Assistance: (A) for LLE OOB/back into bed.  HOB elevated ~30 degrees with supine>sit.  Slow to transition due to pain per pt but did much better today.   Transfers Transfers: Sit to Stand;Stand to [employer] Transfers Sit to Stand: 4: Min assist;With upper extremity assist;With armrests;From bed;From chair/3-in-1 Stand to Sit: 4: Min assist;With upper extremity assist;With armrests;To bed;To chair/3-in-1 Stand Pivot Transfers: 4: Min assist Details for Transfer Assistance: Cues for hand placement, LLE positioning, & technique.  (A) to achieve standing, balance with  standing, RW stabilization, & minor (A) to control descent.   Ambulation/Gait Ambulation/Gait Assistance: 4: Min assist Ambulation Distance (Feet):  (4 hops forwards) Assistive device: Rolling walker Ambulation/Gait Assistance Details: Cues for technique & LLE positioning.   Gait Pattern: Step-to pattern Stairs: No Wheelchair Mobility Wheelchair Mobility: No    Exercises     PT Diagnosis:    PT Problem List:   PT Treatment Interventions:     PT Goals Acute Rehab PT Goals Potential to Achieve Goals: Good PT Goal: Supine/Side to Sit - Progress: Met PT Goal: Sit to Supine/Side - Progress: Met PT Goal: Sit to Stand - Progress: Met PT Goal: Stand to Sit - Progress: Met PT Transfer Goal: Bed to Chair/Chair to Bed - Progress: Met PT Goal: Ambulate - Progress: Met  Visit Information  Last PT Received On: 08/21/11 Assistance Needed: +1    Subjective Data      Cognition  Overall Cognitive Status: Appears within functional limits for tasks assessed/performed Arousal/Alertness: Awake/alert Orientation Level: Appears intact for tasks assessed Behavior During Session: [facility] for tasks performed    Balance     End of Session PT - End of Session Equipment Utilized During Treatment: Gait belt Activity Tolerance: Patient tolerated treatment well Patient left: in bed;with call bell/phone within reach;with nursing in room Nurse Communication: Mobility status    Lara Mulch 08/21/2011, 11:40 AM   Verdell Face, PTA [phone] 08/21/2011

## 2011-08-21 NOTE — Progress Notes (Signed)
Resting comfortably this morning.    Afebrile.  Left ankle dressing clean.  Splint intact.  Right knee and ankle dressings clean.  Palpable distal pulses.  A: s/p ORIF Left ankle, s/p ORIF right tibia  Continue pain management, PT/OT.

## 2011-08-21 NOTE — Consult Note (Signed)
WOC consult Note Reason for Consult:Left scrotal traumatic injury Wound type:traumatic Pressure Ulcer POA: No Measurement:3.5 x 2 x .4cm.   5cm tunnel located at 6 o'clock Wound ENI:DPOEU, pink, moist.  No evidence of necrotic tissue Drainage (amount, consistency, odor) moderate amount of thin, serosanguinous exudate in cannister Periwound: intact. Dressing procedure/placement/frequency: NPWT dressing changed:  Patient required PO pain meds 1 hour prior to dressing change and IV pain med moments prior to dressing removal. White foam placed into tunnel and end allowed to cover wound bed.  Black GranuFoam fashioned into "mushroom" shape placed over white foam.  Hydrocolloid barrier ring placed around granufoam and drape (cut into strips) applied. A seal is obtained and patient expresses no pain.  negative continuous pressure achieved.  Cannister changed. WOC team will follow with you. Thanks, Ladona Mow, MSN, RN, [facility], CWOCN [phone])

## 2011-08-22 ENCOUNTER — Inpatient Hospital Stay (HOSPITAL_COMMUNITY)
Admission: RE | Admit: 2011-08-22 | Discharge: 2011-09-01 | DRG: 945 | Disposition: A | Discharge status: Home Health | Payer: No Typology Code available for payment source | Source: Ambulatory Visit | Attending: Physical Medicine & Rehabilitation | Admitting: Physical Medicine & Rehabilitation

## 2011-08-22 DIAGNOSIS — S32509B Unspecified fracture of unspecified pubis, initial encounter for open fracture: Secondary | ICD-10-CM

## 2011-08-22 DIAGNOSIS — S329XXA Fracture of unspecified parts of lumbosacral spine and pelvis, initial encounter for closed fracture: Secondary | ICD-10-CM

## 2011-08-22 DIAGNOSIS — D62 Acute posthemorrhagic anemia: Secondary | ICD-10-CM | POA: Diagnosis not present

## 2011-08-22 DIAGNOSIS — S060XAA Concussion with loss of consciousness status unknown, initial encounter: Secondary | ICD-10-CM | POA: Diagnosis present

## 2011-08-22 DIAGNOSIS — N390 Urinary tract infection, site not specified: Secondary | ICD-10-CM

## 2011-08-22 DIAGNOSIS — S3120XA Unspecified open wound of penis, initial encounter: Secondary | ICD-10-CM

## 2011-08-22 DIAGNOSIS — S36899A Unspecified injury of other intra-abdominal organs, initial encounter: Secondary | ICD-10-CM

## 2011-08-22 DIAGNOSIS — S3130XA Unspecified open wound of scrotum and testes, initial encounter: Secondary | ICD-10-CM

## 2011-08-22 DIAGNOSIS — S3121XA Laceration without foreign body of penis, initial encounter: Secondary | ICD-10-CM | POA: Diagnosis present

## 2011-08-22 DIAGNOSIS — B9689 Other specified bacterial agents as the cause of diseases classified elsewhere: Secondary | ICD-10-CM

## 2011-08-22 DIAGNOSIS — Z9889 Other specified postprocedural states: Secondary | ICD-10-CM

## 2011-08-22 DIAGNOSIS — S8263XA Displaced fracture of lateral malleolus of unspecified fibula, initial encounter for closed fracture: Secondary | ICD-10-CM

## 2011-08-22 DIAGNOSIS — F17200 Nicotine dependence, unspecified, uncomplicated: Secondary | ICD-10-CM

## 2011-08-22 DIAGNOSIS — G4700 Insomnia, unspecified: Secondary | ICD-10-CM

## 2011-08-22 DIAGNOSIS — S82832A Other fracture of upper and lower end of left fibula, initial encounter for closed fracture: Secondary | ICD-10-CM

## 2011-08-22 DIAGNOSIS — T1490XA Injury, unspecified, initial encounter: Secondary | ICD-10-CM

## 2011-08-22 DIAGNOSIS — S82209A Unspecified fracture of shaft of unspecified tibia, initial encounter for closed fracture: Secondary | ICD-10-CM

## 2011-08-22 DIAGNOSIS — Z5189 Encounter for other specified aftercare: Secondary | ICD-10-CM

## 2011-08-22 DIAGNOSIS — S32599A Other specified fracture of unspecified pubis, initial encounter for closed fracture: Secondary | ICD-10-CM | POA: Diagnosis present

## 2011-08-22 DIAGNOSIS — S82843A Displaced bimalleolar fracture of unspecified lower leg, initial encounter for closed fracture: Secondary | ICD-10-CM

## 2011-08-22 DIAGNOSIS — K5900 Constipation, unspecified: Secondary | ICD-10-CM

## 2011-08-22 LAB — URINALYSIS, ROUTINE W REFLEX MICROSCOPIC
Glucose, UA: NEGATIVE mg/dL
Hgb urine dipstick: NEGATIVE
Ketones, ur: NEGATIVE mg/dL
Protein, ur: NEGATIVE mg/dL
pH: 7.5 (ref 5.0–8.0)

## 2011-08-22 LAB — CBC
MCH: 28.2 pg (ref 26.0–34.0)
MCV: 82.6 fL (ref 78.0–100.0)
Platelets: 304 10*3/uL (ref 150–400)
RBC: 3.44 MIL/uL — ABNORMAL LOW (ref 4.22–5.81)
RDW: 12.7 % (ref 11.5–15.5)
WBC: 8.7 10*3/uL (ref 4.0–10.5)

## 2011-08-22 LAB — CREATININE, SERUM: Creatinine, Ser: 0.83 mg/dL (ref 0.50–1.35)

## 2011-08-22 MED ORDER — BISACODYL 10 MG RE SUPP: 10.0000 mg | Freq: Every day | RECTAL | Status: DC | PRN

## 2011-08-22 MED ORDER — DIPHENHYDRAMINE HCL 25 MG PO CAPS: 25.0000 mg | ORAL_CAPSULE | Freq: Four times a day (QID) | ORAL | Status: DC | PRN

## 2011-08-22 MED ORDER — POLYETHYLENE GLYCOL 3350 17 G PO PACK
17.0000 g | PACK | Freq: Every day | ORAL | Status: DC
  Administered 2011-08-22 – 2011-09-01 (×6): 17 g via ORAL
  Filled 2011-08-22 (×13): qty 1

## 2011-08-22 MED ORDER — OXYCODONE HCL 5 MG PO TABS
5.0000 mg | ORAL_TABLET | ORAL | Status: DC | PRN
  Filled 2011-08-22: qty 3

## 2011-08-22 MED ORDER — ACETAMINOPHEN 325 MG PO TABS: 325.0000 mg | ORAL_TABLET | ORAL | Status: DC | PRN

## 2011-08-22 MED ORDER — OXYCODONE HCL 5 MG PO TABS
10.0000 mg | ORAL_TABLET | ORAL | Status: DC | PRN
  Administered 2011-08-23 (×2): 15 mg via ORAL
  Administered 2011-08-24: 10 mg via ORAL
  Administered 2011-08-25 – 2011-08-27 (×2): 15 mg via ORAL
  Administered 2011-08-28: 10 mg via ORAL
  Administered 2011-08-28 – 2011-08-29 (×2): 15 mg via ORAL
  Administered 2011-08-30: 10 mg via ORAL
  Filled 2011-08-22 (×5): qty 3
  Filled 2011-08-22 (×2): qty 2
  Filled 2011-08-22: qty 3
  Filled 2011-08-22: qty 2

## 2011-08-22 MED ORDER — ALUM & MAG HYDROXIDE-SIMETH 200-200-20 MG/5ML PO SUSP: 30.0000 mL | ORAL | Status: DC | PRN

## 2011-08-22 MED ORDER — TRAMADOL HCL 50 MG PO TABS
100.0000 mg | ORAL_TABLET | Freq: Four times a day (QID) | ORAL | Status: DC
  Administered 2011-08-22 – 2011-09-01 (×37): 100 mg via ORAL
  Filled 2011-08-22 (×4): qty 2
  Filled 2011-08-22: qty 1
  Filled 2011-08-22 (×5): qty 2
  Filled 2011-08-22 (×3): qty 1
  Filled 2011-08-22 (×26): qty 2

## 2011-08-22 MED ORDER — POLYSACCHARIDE IRON COMPLEX 150 MG PO CAPS
150.0000 mg | ORAL_CAPSULE | Freq: Two times a day (BID) | ORAL | Status: DC
  Administered 2011-08-22 – 2011-09-01 (×19): 150 mg via ORAL
  Filled 2011-08-22 (×21): qty 1

## 2011-08-22 MED ORDER — PROCHLORPERAZINE MALEATE 5 MG PO TABS
5.0000 mg | ORAL_TABLET | Freq: Four times a day (QID) | ORAL | Status: DC | PRN
  Filled 2011-08-22: qty 2

## 2011-08-22 MED ORDER — DOCUSATE SODIUM 100 MG PO CAPS
100.0000 mg | ORAL_CAPSULE | Freq: Two times a day (BID) | ORAL | Status: DC
  Administered 2011-08-22 – 2011-09-01 (×19): 100 mg via ORAL
  Filled 2011-08-22 (×24): qty 1

## 2011-08-22 MED ORDER — GUAIFENESIN-DM 100-10 MG/5ML PO SYRP: 5.0000 mL | ORAL_SOLUTION | Freq: Four times a day (QID) | ORAL | Status: DC | PRN

## 2011-08-22 MED ORDER — TRAZODONE HCL 50 MG PO TABS
25.0000 mg | ORAL_TABLET | Freq: Every evening | ORAL | Status: DC | PRN
  Administered 2011-08-22 – 2011-08-30 (×9): 50 mg via ORAL
  Filled 2011-08-22 (×9): qty 1

## 2011-08-22 MED ORDER — PROCHLORPERAZINE EDISYLATE 5 MG/ML IJ SOLN
5.0000 mg | Freq: Four times a day (QID) | INTRAMUSCULAR | Status: DC | PRN
  Filled 2011-08-22: qty 2

## 2011-08-22 MED ORDER — FLEET ENEMA 7-19 GM/118ML RE ENEM
1.0000 | ENEMA | Freq: Once | RECTAL | Status: AC | PRN
  Filled 2011-08-22: qty 1

## 2011-08-22 MED ORDER — PROCHLORPERAZINE 25 MG RE SUPP
12.5000 mg | Freq: Four times a day (QID) | RECTAL | Status: DC | PRN
  Filled 2011-08-22: qty 1

## 2011-08-22 MED ORDER — ENOXAPARIN SODIUM 40 MG/0.4ML ~~LOC~~ SOLN
40.0000 mg | SUBCUTANEOUS | Status: DC
  Administered 2011-08-23 – 2011-09-01 (×9): 40 mg via SUBCUTANEOUS
  Filled 2011-08-22 (×11): qty 0.4

## 2011-08-22 MED ORDER — METHOCARBAMOL 500 MG PO TABS
500.0000 mg | ORAL_TABLET | Freq: Four times a day (QID) | ORAL | Status: DC | PRN
  Administered 2011-08-22: 500 mg via ORAL
  Filled 2011-08-22: qty 1

## 2011-08-22 MED ORDER — METHOCARBAMOL 500 MG PO TABS
500.0000 mg | ORAL_TABLET | Freq: Four times a day (QID) | ORAL | Status: DC
  Administered 2011-08-22 – 2011-08-24 (×7): 1000 mg via ORAL
  Administered 2011-08-24: 500 mg via ORAL
  Administered 2011-08-24: 1000 mg via ORAL
  Administered 2011-08-25 (×2): 500 mg via ORAL
  Administered 2011-08-25: 1000 mg via ORAL
  Administered 2011-08-25: 500 mg via ORAL
  Administered 2011-08-26: 1000 mg via ORAL
  Administered 2011-08-26 (×3): 500 mg via ORAL
  Administered 2011-08-27: 1000 mg via ORAL
  Administered 2011-08-27 – 2011-08-28 (×7): 500 mg via ORAL
  Administered 2011-08-29 – 2011-08-30 (×5): 1000 mg via ORAL
  Administered 2011-08-30 (×2): 500 mg via ORAL
  Filled 2011-08-22: qty 1
  Filled 2011-08-22 (×25): qty 2
  Filled 2011-08-22: qty 1
  Filled 2011-08-22 (×5): qty 2
  Filled 2011-08-22: qty 1
  Filled 2011-08-22 (×6): qty 2
  Filled 2011-08-22: qty 1
  Filled 2011-08-22 (×5): qty 2
  Filled 2011-08-22: qty 1
  Filled 2011-08-22 (×2): qty 2

## 2011-08-22 MED ORDER — BIOTENE DRY MOUTH MT LIQD
15.0000 mL | Freq: Two times a day (BID) | OROMUCOSAL | Status: DC
  Administered 2011-08-22 – 2011-09-01 (×16): 15 mL via OROMUCOSAL

## 2011-08-22 NOTE — Progress Notes (Signed)
Rehab admissions - Evaluated for possible admission.  I spoke with patient and gave him rehab booklets.  He would like inpatient rehab prior to home with sister.  Bed available and can admit to inpatient rehab today.  Call me for questions.  #[phone]

## 2011-08-22 NOTE — Clinical Social Work Note (Signed)
Clinical Social Worker continuing to follow patient to offer support and address discharge planning needs.  Per PA, patient is to be admitted to inpatient rehab today prior to discharge home with sister.    Clinical Social Worker will sign off for now as social work intervention is no longer needed. Please consult [place] again if new need arises.  [address] [place], [place] [phone]
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
## 2011-08-22 NOTE — Progress Notes (Signed)
Orthopedic Tech Progress Note Patient Details:  Xavier Matthews 01-22-1961 [id number]  Ortho Devices Type of Ortho Device: CAM walker Ortho Device/Splint Interventions: Adjustment;Application   Xavier Matthews, Xavier Matthews 08/22/2011, 10:47 AM

## 2011-08-22 NOTE — Progress Notes (Signed)
Physical Therapy Treatment Patient Details Name: Xavier Matthews MRN: [id number] DOB: 1960-12-13 Today's Date: 08/22/2011 Time: 4098-1191 PT Time Calculation (min): 26 min  PT Assessment / Plan / Recommendation Comments on Treatment Session  Pt continues to make steady progress with mobility.  Pt reports increased pain in pelvis & lower back today but still agreeable to participate.  Pt able to ambulate ~10' (5' + 5') today.      Follow Up Recommendations  Inpatient Rehab    Barriers to Discharge        Equipment Recommendations  Defer to next venue    Recommendations for Other Services Rehab consult  Frequency Min 5X/week   Plan Discharge plan remains appropriate    Precautions / Restrictions Precautions Precautions: Fall Restrictions Weight Bearing Restrictions: Yes RLE Weight Bearing: Weight bearing as tolerated LLE Weight Bearing: Touchdown weight bearing       Mobility  Bed Mobility Bed Mobility: Supine to Sit Supine to Sit: HOB flat;With rails;4: Min assist Details for Bed Mobility Assistance: (A) for LLE.   Transfers Transfers: Sit to Stand;Stand to Sit Sit to Stand: 4: Min assist;With upper extremity assist;With armrests;From bed;From chair/3-in-1 Stand to Sit: 4: Min guard;With upper extremity assist;With armrests;To chair/3-in-1 Stand Pivot Transfers: 4: Min assist Details for Transfer Assistance: Cues to start with UE's/hands closer to end of armrests, cues for LLE positioning.  (A) for balance, RW stabilization, & safety.   Ambulation/Gait Ambulation/Gait Assistance: 4: Min assist Ambulation Distance (Feet): 10 Feet (5' + 5' ) Assistive device: Rolling walker Ambulation/Gait Assistance Details: Cues for sequencing, technique, LLE positioning, safe positioning inside RW.  Pt tends to use UE's to "float" & offload weight on RLE rather than hop.   Gait Pattern: Step-to pattern General Gait Details: Uses UE's a lot to offload wt bearing through pelvis.   Stairs:  No Wheelchair Mobility Wheelchair Mobility: No    Exercises     PT Diagnosis:    PT Problem List:   PT Treatment Interventions:     PT Goals Acute Rehab PT Goals Time For Goal Achievement: 09/01/11 Potential to Achieve Goals: Good Pt will go Supine/Side to Sit: with modified independence;with HOB 0 degrees PT Goal: Supine/Side to Sit - Progress: Goal set today Pt will Sit at Edge of Bed: Independently;with no upper extremity support;6-10 min PT Goal: Sit at Edge Of Bed - Progress: Goal set today Pt will go Sit to Supine/Side: with modified independence;with HOB 0 degrees PT Goal: Sit to Supine/Side - Progress: Goal set today Pt will go Sit to Stand: with modified independence;with upper extremity assist PT Goal: Sit to Stand - Progress: Goal set today Pt will go Stand to Sit: with modified independence;with upper extremity assist PT Goal: Stand to Sit - Progress: Goal set today Pt will Transfer Bed to Chair/Chair to Bed: with modified independence PT Transfer Goal: Bed to Chair/Chair to Bed - Progress: Goal set today Pt will Ambulate: 16 - 50 feet;with supervision;with least restrictive assistive device;with rolling walker PT Goal: Ambulate - Progress: Goal set today  Visit Information  Last PT Received On: 08/22/11 Assistance Needed: +1    Subjective Data      Cognition  Overall Cognitive Status: Appears within functional limits for tasks assessed/performed Arousal/Alertness: Awake/alert Orientation Level: Appears intact for tasks assessed Behavior During Session: [facility] for tasks performed    Balance     End of Session PT - End of Session Equipment Utilized During Treatment: Gait belt Activity Tolerance: Patient tolerated treatment well;Patient  limited by pain Patient left: in chair;with call bell/phone within reach    Lara Mulch 08/22/2011, 1:44 PM  Verdell Face, PTA [phone] 08/22/2011
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
## 2011-08-22 NOTE — Progress Notes (Signed)
To CIR Espiridion Supinski, MD, MPH, FACS Pager: [phone]  

## 2011-08-22 NOTE — Progress Notes (Signed)
Pt. Admitted to rehab @ 1530 from 5008.  Report received from Lauren, RN.  VSS, AOX3.  Pt. Oriented to rehab unit.

## 2011-08-22 NOTE — Progress Notes (Signed)
No new c/o, sitting up in chair  AFVSS  R LE knee dressing d/c'd, inc c/d/i LLE splint removed Small abrasions ant to inc, inc c/d/i Redressed LLE NVID bilat.   A/P: Cont WBAT RLE TDWB LLE- will get into fx boot today Wound vac to pelvic wound per trauma team Will follow.  Ave Filter [phone]

## 2011-08-22 NOTE — Discharge Summary (Signed)
Xavier Meharg, MD, MPH, FACS Pager: [phone]  

## 2011-08-22 NOTE — Progress Notes (Signed)
Patient ID: Xavier Matthews, male   DOB: 1960/04/19, 51 y.o.   MRN: [id number]

## 2011-08-22 NOTE — Progress Notes (Signed)
UR complete 
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
## 2011-08-23 DIAGNOSIS — M79609 Pain in unspecified limb: Secondary | ICD-10-CM

## 2011-08-23 DIAGNOSIS — Z5189 Encounter for other specified aftercare: Secondary | ICD-10-CM

## 2011-08-23 DIAGNOSIS — S82843A Displaced bimalleolar fracture of unspecified lower leg, initial encounter for closed fracture: Secondary | ICD-10-CM

## 2011-08-23 DIAGNOSIS — S329XXA Fracture of unspecified parts of lumbosacral spine and pelvis, initial encounter for closed fracture: Secondary | ICD-10-CM

## 2011-08-23 DIAGNOSIS — T1490XA Injury, unspecified, initial encounter: Secondary | ICD-10-CM

## 2011-08-23 LAB — CBC
Hemoglobin: 9.9 g/dL — ABNORMAL LOW (ref 13.0–17.0)
MCHC: 33.6 g/dL (ref 30.0–36.0)
Platelets: 341 10*3/uL (ref 150–400)
RDW: 12.7 % (ref 11.5–15.5)

## 2011-08-23 LAB — COMPREHENSIVE METABOLIC PANEL
ALT: 65 U/L — ABNORMAL HIGH (ref 0–53)
AST: 74 U/L — ABNORMAL HIGH (ref 0–37)
Albumin: 2.8 g/dL — ABNORMAL LOW (ref 3.5–5.2)
Alkaline Phosphatase: 109 U/L (ref 39–117)
Potassium: 4.4 mEq/L (ref 3.5–5.1)
Sodium: 131 mEq/L — ABNORMAL LOW (ref 135–145)
Total Protein: 7 g/dL (ref 6.0–8.3)

## 2011-08-23 LAB — DIFFERENTIAL
Eosinophils Absolute: 0.5 10*3/uL (ref 0.0–0.7)
Eosinophils Relative: 6 % — ABNORMAL HIGH (ref 0–5)
Lymphocytes Relative: 20 % (ref 12–46)
Lymphs Abs: 1.8 10*3/uL (ref 0.7–4.0)
Monocytes Relative: 16 % — ABNORMAL HIGH (ref 3–12)

## 2011-08-23 MED ORDER — OXYCODONE HCL 5 MG PO TABS
15.0000 mg | ORAL_TABLET | Freq: Two times a day (BID) | ORAL | Status: DC
  Administered 2011-08-23 – 2011-08-30 (×14): 15 mg via ORAL
  Filled 2011-08-23 (×13): qty 3

## 2011-08-23 MED ORDER — OXYCODONE HCL 5 MG PO TABS
10.0000 mg | ORAL_TABLET | ORAL | Status: DC | PRN
  Administered 2011-08-23: 15 mg via ORAL

## 2011-08-23 MED ORDER — OXYCODONE HCL 5 MG PO TABS
15.0000 mg | ORAL_TABLET | Freq: Once | ORAL | Status: DC
  Filled 2011-08-23: qty 3

## 2011-08-23 MED ORDER — OXYCODONE HCL 10 MG PO TB12: 20.0000 mg | ORAL_TABLET | Freq: Two times a day (BID) | ORAL | Status: DC

## 2011-08-23 MED ORDER — OXYCODONE HCL 10 MG PO TB12
20.0000 mg | ORAL_TABLET | Freq: Two times a day (BID) | ORAL | Status: DC
  Administered 2011-08-23 – 2011-08-28 (×11): 20 mg via ORAL
  Filled 2011-08-23 (×3): qty 2
  Filled 2011-08-23: qty 1
  Filled 2011-08-23: qty 2
  Filled 2011-08-23: qty 1
  Filled 2011-08-23 (×4): qty 2
  Filled 2011-08-23 (×2): qty 1
  Filled 2011-08-23: qty 2
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
## 2011-08-23 NOTE — Progress Notes (Signed)
Inpatient Rehabilitation Center Individual Statement of Services  Patient Name:  Xavier Matthews  Date:  08/23/2011  Welcome to the Inpatient Rehabilitation Center.  Our goal is to provide you with an individualized program based on your diagnosis and situation, designed to meet your specific needs.  With this comprehensive rehabilitation program, you will be expected to participate in at least 3 hours of rehabilitation therapies Monday-Friday, with modified therapy programming on the weekends.  Your rehabilitation program will include the following services:  Physical Therapy (PT), Occupational Therapy (OT), 24 hour per day rehabilitation nursing, Therapeutic Recreaction (TR), Case Management (RN and Child psychotherapist), Rehabilitation Medicine, Nutrition Services and Pharmacy Services  Weekly team conferences will be held on Wednesdays to discuss your progress.  Your RN Case Designer, television/film set will talk with you frequently to get your input and to update you on team discussions.  Team conferences with you and your family in attendance may also be held.  Expected length of stay: 12 days   Overall anticipated outcome: modified independent  Depending on your progress and recovery, your program may change.  Your RN Case Estate agent will coordinate services and will keep you informed of any changes.  Your RN Sports coach and SW names and contact numbers are listed  below.  The following services may also be recommended but are not provided by the Inpatient Rehabilitation Center:   Driving Evaluations  Home Health Rehabiltiation Services  Outpatient Rehabilitatation [facility]  Vocational Rehabilitation   Arrangements will be made to provide these services after discharge if needed.  Arrangements include referral to agencies that provide these services.  Your insurance has been verified to be:  Medpay Your primary doctor is:  _________________  Pertinent information will be  shared with your doctor and your insurance company.  Case Manager: Lutricia Horsfall, [facility] [phone]  Social Worker:  Dossie Der, [place] [phone]  Information discussed with and copy given to patient by: Meryl Dare, 08/23/2011
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
## 2011-08-23 NOTE — Progress Notes (Signed)
Occupational Therapy Session Note  Patient Details  Name: Xavier Matthews MRN: [id number] Date of Birth: [birth year]  Today's Date: 08/23/2011 Time: 4098-1191 and 1302-1400 Time Calculation (min): 55 min and 58 min  Short Term Goals: Week 1:  OT Short Term Goal 1 (Week 1): STG = LTGs due to short LOS  Skilled Therapeutic Interventions/Progress Updates:  1) OT eval, ADL assessment conducted.  Pt completed bathing and dressing at sink level and bed level for LB dressing.  Simulated toilet and tub/shower transfer with RW.  Pt will most likely not bathe at shower level until weight bearing restrictions and would vac are removed.  Pt min-mod assist with toilet transfer from regular height commode, will benefit from 3-in-1 to elevate commode and provide rails to increase safety with sit to stand.  Pt min-mod assist with tub/shower transfer with use of tub bench secondary to TDWB on LLE and inability to step over ledge.  Pt with decreased recall of TDWB on LLE and would drag LLE behind with ambulation.  2)  1:1 OT with focus on sit to stand and transfer training with instruction on BLE positioning prior to sit > stand, hand placement, and LLE placement in standing.  Pt prefers to keep LLE extended behind with sit to stand and in standing, instructed pt to bring leg in front to increase adherence to TDWB restriction.  Completed activity in standing with focus on weight shifting through RLE as tolerated.  Pt with sweating and requiring rest breaks secondary to pain in RLE in standing.    Therapy Documentation Precautions:  Precautions Precautions: Fall Required Braces or Orthoses: Other Brace/Splint;Knee Immobilizer - Right Knee Immobilizer - Right: On when out of bed or walking Other Brace/Splint: L camwalker Restrictions Weight Bearing Restrictions: Yes RLE Weight Bearing: Weight bearing as tolerated LLE Weight Bearing: Touchdown weight bearing General:   Vital Signs: Therapy Vitals Pulse Rate:  144  (with gait) Pain: Pain Assessment Pain Assessment: 0-10 Pain Score:   7 Pain Type: Acute pain Pain Location: Leg Pain Orientation: Right Pain Intervention(s):  (premedicated) ADL: ADL Grooming: Setup Where Assessed-Grooming: Sitting at sink Upper Body Bathing: Setup Where Assessed-Upper Body Bathing: Sitting at sink Lower Body Bathing: Maximal assistance Where Assessed-Lower Body Bathing: Sitting at sink Upper Body Dressing: Setup Where Assessed-Upper Body Dressing: Sitting at sink Lower Body Dressing: Moderate assistance Where Assessed-Lower Body Dressing: Bed level Toilet Transfer: Moderate assistance Toilet Transfer Method: Stand pivot Toilet Transfer Equipment: Engineer, technical sales: Moderate cueing;Minimal assistance (simulated) Tub/Shower Transfer Method: Ambulating Tub/Shower Equipment: Transfer tub bench  See FIM for current functional status  Therapy/Group: Individual Therapy  [name] 08/23/2011, 11:48 AM

## 2011-08-23 NOTE — Progress Notes (Signed)
Bilateral lower extremity venous duplex completed.  Preliminary report is negative for DVT, SVT, or a Baker's cyst. 

## 2011-08-23 NOTE — Consult Note (Addendum)
Wound care follow-up:  Vac dressing changed.  Pt medicated prior to procedure and tolerated with minimal discomfort.  One piece white foam packed into wound bed, then black foam mushroom applied with barrier ring around site to maintain seal.  Con't suction on at .  Wound 3.5X2X4.5cm to tunneling area.  Mod pink-tinged drainage, no odor.  Plan dressing change Friday.   Cammie Mcgee, RN, MSN, [employer]  [phone]

## 2011-08-23 NOTE — Progress Notes (Signed)
Patient information reviewed and entered into UDS-PRO system by Cleopha Indelicato, RN, CRRN, PPS Coordinator.  Information including medical coding and functional independence measure will be reviewed and updated through discharge.    
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
## 2011-08-23 NOTE — Progress Notes (Signed)
Social Work Patient ID: Xavier Matthews, male   DOB: January 22, 1961, 51 y.o.   MRN: [id number]  Met with pt and girlfriend to inform team conference goals-mod/i-supervision level and discharge date 6/7. Pt hope he will not have to go home with a VAC, according to pt WOC-RN reports he is healing well.  Continue To work on discharge needs and will make referrals closer to discharge.

## 2011-08-24 LAB — URINE CULTURE
Colony Count: 2000
Culture  Setup Time: 201305290228

## 2011-08-24 NOTE — Progress Notes (Signed)
Occupational Therapy Session Note  Patient Details  Name: Xavier Matthews MRN: [id number] Date of Birth: Feb 15, 1961  Today's Date: 08/24/2011 Time: 1005-1020 Time Calculation (min): 15 min  General Amount of Missed OT Time (min): 45 Minutes Pain: Pain Assessment Pain Assessment: 0-10 Pain Score:   8 Pain Type:  (throbbing) Pain Location: Leg Pain Orientation: Lower;Right;Anterior Pain Descriptors: Throbbing Pain Onset: On-going (worse with weight bearing) Pain Intervention(s): RN made aware;Repositioned (ice, premedicated)  Pt missed 45 mins skilled OT session secondary to high pain level post PT session.  Pt reports noticed swelling in RLE this AM and with weight bearing the pain and swelling seamed to get worse.  Pt refused therapy at this time, will attempt to follow up.  Repositioned pt with propping RLE on pillows and applying ice.  RN in with pt.   [name] 08/24/2011, 1:27 PM

## 2011-08-24 NOTE — Progress Notes (Signed)
Physical Therapy Session Note  Patient Details  Name: Xavier Matthews MRN: [id number] Date of Birth: 03-20-1961  Today's Date: 08/24/2011 Time: 0102-7253 Time Calculation (min): 60 min  Short Term Goals: Week 1:  PT Short Term Goal 1 (Week 1): STG = LTG  Skilled Therapeutic Interventions/Progress Updates:    Increased pain and swelling RLE today, pt using ice and elevating it in bed but did not want to do any weight bearing as he stated he just got his pain back down from a 9/10 after morning therapy. Agreed to Therapeutic exercise performed with LE to increase strength for functional mobility. Also UE with 10 # weights chest press 15 x 2, shoulder IR/ER 10# supine 15 x 2 for joint preservation, bicep curls and overhead tricep presses 15 x 2 10#  S scoot pivot transfers with w/c armrest removed, level surfaces. Mod I w/c mobility   Therapy Documentation Precautions:  Precautions Precautions: Fall Required Braces or Orthoses: Other Brace/Splint (L camwalker) Knee Immobilizer - Right: On when out of bed or walking Other Brace/Splint: L camwalker boot Restrictions Weight Bearing Restrictions: Yes RLE Weight Bearing: Weight bearing as tolerated LLE Weight Bearing: Touchdown weight bearing Pain: Pain Assessment Pain Assessment: 0-10 Pain Score:   5 Pain Type:  (throbbing) Pain Location: Leg Pain Orientation: Lower;Right;Anterior Pain Descriptors: Throbbing Pain Onset: On-going (worse with weight bearing) Pain Intervention(s): RN made aware;Repositioned (ice, premedicated)   Exercises: General Exercises - Lower Extremity Ankle Circles/Pumps: Right;20 reps;Supine Gluteal Sets: 15 reps;Other (comment) (LE over bolster) Short Arc Quad: 15 reps;AROM;Supine Heel Slides: 15 reps;AROM;Both Hip ABduction/ADduction: 15 reps;AROM;Sidelying Other Exercises Other Exercises: trunk rotation Other Exercises: crunches x 15 Other Exercises: R ankle inversion/eversion x 20 Other Exercises:  hip IR/ER with knees bent, supine     See FIM for current functional status  Therapy/Group: Individual Therapy  Michaelene Song 08/24/2011, 1:42 PM

## 2011-08-24 NOTE — Progress Notes (Signed)
Physical Therapy Session Note  Patient Details  Name: Xavier Matthews MRN: [id number] Date of Birth: 08-18-1960  Today's Date: 08/24/2011 Time: [phone] and [phone] Time Calculation (min): 52 min and 36 min  Short Term Goals: Week 1:  PT Short Term Goal 1 (Week 1): STG = LTG  Therapy Documentation Precautions:  Precautions Precautions: Fall Required Braces or Orthoses: Other Brace/Splint Knee Immobilizer - Right: On when out of bed or walking Other Brace/Splint: L camwalker boot Restrictions Weight Bearing Restrictions: Yes RLE Weight Bearing: Weight bearing as tolerated LLE Weight Bearing: Touchdown weight bearing Vital Signs: Therapy Vitals Pulse Rate: 125  (after ambulation) Pain: Pain Assessment Pain Assessment: 0-10 Pain Score:   8 Pain Type: Acute pain Pain Location: Leg Pain Orientation: Lower;Right;Anterior Pain Descriptors: Throbbing Pain Onset: Gradual Pain Intervention(s): Other (Comment);Cold applied;Rest (RN notified) Mobility:  Patient performed multiple squat pivot transfers bed > w/c <> mat and w/c > recliner with squat pivot and stand pivot with RW with min-mod A and multiple verbal and visual cues to maintain TDWB LLE and for safe hand placement; sit <> stand training on elevated mat with UE on chair arm rests in front of patient with assistance to maintain LLE TDWB with patient sliding UE forward on chair to facilitate full anterior lean and extension of RLE to stand.  Unable to perform multiple sit <> stand secondary to RLE increased pain and edema; returned to room and recliner and placed ice on lower RLE. Locomotion : Ambulation Ambulation/Gait Assistance: 3: Patient stating that he would like to use crutches for gait; discussed with patient that crutches require increased balance and that RW allows wider BOS and stability to push through; demonstrated to how to perform gait with crutches over level ground and on stairs;  When patient attempted to stand  and take a step with the crutches he experienced a lateral LOB; changed back to RW; gait training with RW x 25' with Mod assist to fully clear LLE (drags it behind him) and for cues to maintain body inside RW to allow full UE extension to lift body  Wheelchair Mobility Distance: 150 in controlled environment with supervision and verbal cues for w/c parts management in preparation for transfers w/c <> mat   PM session: Pain improved with ice.  Transfers bed <> w/c with supervision and verbal cues for w/c parts management, no cues needed this pm to maintain WB status.  Focus on community w/c mobility training with propulsion >1,000 feet on/off elevators safely, in crowded environment safely, over uneven pavement up and down hills with use of hands to slow speed of descent and cued to lean forward when propelling uphill to transfer body weight to front of chair to prevent tipping backwards, safely crossing street, bumping w/c up and down sidewalk and transferring from w/c to [employer] booth" with verbal cues for sequence.  Mod-max A to safely manage uphill and downhill w/c propulsion on sidewalk.  At end of session returned to bed and ice pack placed over R lower LE.  See FIM for current functional status  Therapy/Group: Individual Therapy  Edman Circle [facility] 08/24/2011, 12:17 PM
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
## 2011-08-24 NOTE — Patient Care Conference (Signed)
Inpatient RehabilitationTeam Conference Note Date: 08/24/2011   Time: 10:02 AM    Patient Name: Xavier Matthews      Medical Record Number: [id number]  Date of Birth: 1960/07/15 Sex: Male         Room/Bed: 4007/4007-01 Payor Info: Payor: MED PAY  Plan: MED PAY ASSURANCE  Product Type: *No Product type*     Admitting Diagnosis: L pubic rami Fx, L ankle fx  Admit Date/Time:  08/22/2011  5:19 PM Admission Comments: No comment available   Primary Diagnosis:  Trauma Principal Problem: Trauma  Patient Active Problem List  Diagnoses Date Noted  . Trauma 08/23/2011  . [facility] 08/17/2011  . Penile lacerations 08/17/2011  . Scrotal laceration 08/17/2011  . Concussion 08/17/2011  . Right tib/fib fractures 08/17/2011  . Left ankle fracture 08/17/2011  . Open bilateral superior pubic rami fractures 08/17/2011  . Acute blood loss anemia 08/17/2011    Expected Discharge Date: Expected Discharge Date: 09/01/11  Team Members Present: Physician: Dr. Diona Browner, RN Case Manager Present: Lutricia Horsfall, RN Social Worker Present: Dossie Der, LCSW PT Present: Wanda Plump, PT OT Present: [name], Felipa Eth, OT     Current Status/Progress Goal Weekly Team Focus  Medical   pain in the left ankle. Left lateral malleolus fracture, right tibial fracture, pelvic fracture, skin areas in the perineum and scrotal area  improved pain management, increased tolerance to activity  adjust medications as needed the   Bowel/Bladder       Continent of bowel and bladder with min assist        Remain continent with min assist upon discharge     Swallow/Nutrition/ Hydration             ADL's     Evals pending        Mobility     ''        Communication             Safety/Cognition/ Behavioral Observations            Pain        Reported breakthrough pain; medication schedule addressed           Patient will report 3 or less pain scale upon discharge    Manage pain with medication adjustments as needed  Skin     Multiple wounds, penile, scrotal area.  Eval pending.           *See Interdisciplinary Assessment and Plan and progress notes for long and short-term goals  Barriers to Discharge: need to identify caregiver    Possible Resolutions to Barriers:  training caregiver    Discharge Planning/Teaching Needs:         Team Discussion:  New pt: Evals in process. Discussion of pt's injuries. Has wound vac at supra pubic area. TDWB. HR high when ambulating; check for orthostatic BPs.  Revisions to Treatment Plan:     Continued Need for Acute Rehabilitation Level of Care: The patient requires daily medical management by a physician with specialized training in physical medicine and rehabilitation for the following conditions: Daily direction of a multidisciplinary physical rehabilitation program to ensure safe treatment while eliciting the highest outcome that is of practical value to the patient.: Yes Daily medical management of patient stability for increased activity during participation in an intensive rehabilitation regime.: Yes Daily analysis of laboratory values and/or radiology reports with any subsequent need for medication adjustment of medical intervention for : Post surgical problems;Other  Lane-Morgan,  Ninfa Linden 08/24/2011, 10:02 AM

## 2011-08-25 ENCOUNTER — Inpatient Hospital Stay (HOSPITAL_COMMUNITY): Payer: No Typology Code available for payment source

## 2011-08-25 DIAGNOSIS — S329XXA Fracture of unspecified parts of lumbosacral spine and pelvis, initial encounter for closed fracture: Secondary | ICD-10-CM

## 2011-08-25 DIAGNOSIS — S82843A Displaced bimalleolar fracture of unspecified lower leg, initial encounter for closed fracture: Secondary | ICD-10-CM

## 2011-08-25 DIAGNOSIS — Z5189 Encounter for other specified aftercare: Secondary | ICD-10-CM

## 2011-08-25 LAB — CBC
MCHC: 33.4 g/dL (ref 30.0–36.0)
RDW: 12.9 % (ref 11.5–15.5)
WBC: 12.7 10*3/uL — ABNORMAL HIGH (ref 4.0–10.5)

## 2011-08-25 NOTE — Progress Notes (Signed)
Physical Therapy Session Note  Patient Details  Name: Xavier Matthews MRN: [id number] Date of Birth: Dec 14, 1960  Today's Date: 08/25/2011 Time: 0900 -1002 and 1345-1415 Time Calculation (min): 62 min   Short Term Goals: Week 1:  PT Short Term Goal 1 (Week 1): STG = LTG  Skilled Therapeutic Interventions/Progress Updates:   Patient still reporting increased pain in R lower leg; patient had xray of RLE this am; MD to review.  Patient not to place weight through RLE until xray reviewed by MD but patient willing to complete therapeutic ex.   Therapy Documentation Precautions:  Precautions Precautions: Fall Required Braces or Orthoses: Other Brace/Splint (L Cam walker boot) Knee Immobilizer - Right: On at all times Other Brace/Splint: L camwalker boot Restrictions Weight Bearing Restrictions: Yes RLE Weight Bearing: Weight bearing as tolerated LLE Weight Bearing: Touchdown weight bearing Vital Signs:  HR: 103 after UE ergometer Pain: Pain Assessment Pain Assessment: No/denies pain Pain Score: 0-No pain Pain Location: Leg Pain Orientation: Right;Lower Pain Descriptors: Throbbing Pain Frequency: Intermittent Pain Onset: On-going Patients Stated Pain Goal: 2 Pain Intervention(s): Medication (See eMAR) Mobility:  Patient performing bed mobility from flat bed and mat mod I, bed <> w/c transfers scooting with UE only supervision.  Patient performed w/c mobility on unit and w/c parts management for set up with supervision and one verbal cues to remove leg rests.   Exercises:  Performed bilat UE strengthening and endurance on UBE x 4 minutes forwards, 4 min backwards with one rest break, seated bilat UE tricep and latissimus push ups 2 sets x 10 reps.  Also performed seated, supine and sidelying open chain bilat LE strengthening exercises with resistance of blue theraband x 12 reps each exercise, each LE: ankle PF, inversion/eversion, knee flexion, extension, hip flexion in sitting EOB,  supine SLR, hip ABD, sidelying hip ABD and extension; LLE ankle exercise performed without resistance to prevent WB.  Patient tolerated well with no reports of pain; ice pack replaced R lower leg following exercises.   PM session: patient's multiple sister's present including the sister that he will be staying with upon D/C; patient demonstrated to sister how to don CAM boot for OOB activity and mod I bed mobility and supervision transfers bed <> w/c and w/c parts management; performed simulated car (PT Cruiser) transfer w/c <> car with squat scoot with supervision with one verbal cue to fully remove leg rests prior to scooting.  Demonstrated to family how to perform w/c bumping up and down one step (curb) and verbalized sequence for bumping w/c up and down 2 steps for home entry and exit if patient is not able to WB through RLE; family verbalized understanding; will issue handout with pictures of how to perform if patient is NWB RLE.  Ice pack placed on RLE for edema and pain management.  See FIM for current functional status  Therapy/Group: Individual Therapy  [name] 08/25/2011, 10:08 AM

## 2011-08-25 NOTE — Progress Notes (Signed)
Occupational Therapy Session Note  Patient Details  Name: Xavier Matthews MRN: [id number] Date of Birth: [birth year]  Today's Date: 08/25/2011 Time: 0730-0810 Time Calculation (min): 40 min  Short Term Goals: Week 1:  OT Short Term Goal 1 (Week 1): STG = LTGs due to short LOS  Skilled Therapeutic Interventions/Progress Updates:    Pt seen for ADL retraining at sink level from w/c secondary to wound vac and RLE pain.  Pt reports pain and swelling to be persistent.  Pt completed scoot transfer from bed to w/c with arm rest removed with cues for TDWB only on LLE and WBAT on RLE, at this time pt is tolerating little weight bearing on RLE.  Pt completed LB bathing with lateral leans to decrease time in standing.  LB dressing completed seated at EOB with lateral leans.  Session ended 5 mins early due to x-ray on RLE.  Therapy Documentation Precautions:  Precautions Precautions: Fall Required Braces or Orthoses: Other Brace/Splint (L Cam walker boot) Knee Immobilizer - Right: On at all times Other Brace/Splint: L camwalker boot Restrictions Weight Bearing Restrictions: Yes RLE Weight Bearing: Weight bearing as tolerated LLE Weight Bearing: Touchdown weight bearing Pain: Pain Assessment Pain Assessment: No/denies pain Pain Score: 0-No pain   See FIM for current functional status  Therapy/Group: Individual Therapy  [name] 08/25/2011, 12:32 PM
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
## 2011-08-25 NOTE — Consult Note (Signed)
Wound care follow-up:  Vac dressing changed to scrotum wound.  Unchanged from previous assessment.  Barrier ring applied to maintain seal, one piece white foam, black foam mushroom, then track pad.  Con't suction on at .  No odor, mod pink drainage.  Plan dressing Mon.  Pt tolerated with minimal discomfort.  Cammie Mcgee, RN, MSN, [employer]  [phone]

## 2011-08-25 NOTE — Progress Notes (Signed)
Occupational Therapy Note  Patient Details  Name: Xavier Matthews MRN: [id number] Date of Birth: 09-Oct-1960 Today's Date: 08/25/2011 Time:  1430-1530  ( ) Pain:  Left leg 4/10  Group therapy  Engaged therapeutic activities group with focus on [place] mobility, Upper extremity strengthening using ergometer and 10 # weights.  Pt transferred to mat for increased trunk contol while sitting and doing weights.  He did weights with cues for form and control.     Humberto Seals 08/25/2011, 4:16 PM

## 2011-08-26 NOTE — Progress Notes (Signed)
Physical Therapy Session Note  Patient Details  Name: Xavier Matthews MRN: [id number] Date of Birth: 1960/06/03  Today's Date: 08/26/2011 Time: 0830-0900 Time Calculation (min): 30 min  Short Term Goals: Week 1:  PT Short Term Goal 1 (Week 1): STG = LTG  Therapy Documentation Precautions:  Precautions Precautions: Fall Required Braces or Orthoses: Other Brace/Splint (L Cam walker boot) Knee Immobilizer - Right: On at all times Other Brace/Splint: L camwalker boot Restrictions Weight Bearing Restrictions: Yes RLE Weight Bearing: Weight bearing as tolerated LLE Weight Bearing: Touchdown weight bearing Pain: Patient was having pain in Right LE primarily and was given pain medications as this PT entered room.  Therapeutic Exercise: (15') B LE's sitting in W/C Gait Training: (15') Gait using RW 2 x 140' with S/Mod-I.  Verbal cues for sequencing, TTWB L LE, step-to gait pattern with L LE leading. Patient able to maintain TTWB status.   See FIM for current functional status  Therapy/Group: Individual Therapy  Maksym Pfiffner J 08/26/2011, 8:53 AM
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
## 2011-08-26 NOTE — Progress Notes (Signed)
Physical Therapy Note  Patient Details  Name: Xavier Matthews MRN: [id number] Date of Birth: 02-11-1961 Today's Date: 08/26/2011 Time: 1530-1630 (60')  OEG-Leg Exercise Group Pain: 0-3/10 Right Lower leg  Therapeutic Exercise: (60') Supine on mat table with LE exercises for Right and Left LE   Group Therapy   Rex Kras 08/26/2011, 4:49 PM

## 2011-08-26 NOTE — Progress Notes (Signed)
Occupational Therapy Session Note  Patient Details  Name: Xavier Matthews MRN: [id number] Date of Birth: Feb 24, 1961  Today's Date: 08/26/2011  Session 1  Time: 1000-1038 Time Calculation (min): 38 min  Short Term Goals: Week 1:  OT Short Term Goal 1 (Week 1): STG = LTGs due to short LOS  Skilled Therapeutic Interventions: ADL-retraining at w/c level, sink side, with emphasis on chair transfers, dynamic sitting balance, safety awareness, and cognition.    Patient able to complete upper and lower body bathing and dressing with only supervision and setup assist.   No evidence of cognitive impairment noted; patient processes multi-step instructions well and able to complete BADL, dividing and alternating attention with distractions from staff and phone.     Therapy Documentation Precautions:  Precautions Precautions: Fall Required Braces or Orthoses: Other Brace/Splint (L Cam walker boot) Knee Immobilizer - Right: On at all times Other Brace/Splint: L camwalker boot Restrictions Weight Bearing Restrictions: Yes RLE Weight Bearing: Weight bearing as tolerated LLE Weight Bearing: Touchdown weight bearing  Pain: Pain Assessment Pain Assessment: 0-10 Pain Score: 0-No pain Pain Type: Acute pain Pain Location: Leg Pain Orientation: Right;Lower Pain Descriptors: Aching Pain Frequency: Intermittent Pain Onset: Gradual Pain Intervention(s): Medication (See eMAR)  ADL: ADL Grooming: Setup Where Assessed-Grooming: Sitting at sink Upper Body Bathing: Setup Where Assessed-Upper Body Bathing: Sitting at sink Lower Body Bathing: Maximal assistance Where Assessed-Lower Body Bathing: Sitting at sink Upper Body Dressing: Setup Where Assessed-Upper Body Dressing: Sitting at sink Lower Body Dressing: Moderate assistance Where Assessed-Lower Body Dressing: Bed level Toilet Transfer: Moderate assistance Toilet Transfer Method: Stand pivot Toilet Transfer Equipment: Financial planner: Moderate cueing;Minimal assistance (simulated) Tub/Shower Transfer Method: Ambulating Tub/Shower Equipment: Transfer tub bench  Therapy/Group: Individual Therapy  Session 2  Time: 4098-1191 Time Calculation (min):  Skilled Therapeutic Interventions: Therapeutic exercise with emphasis on endurance and UE general strengthening.  Patient completed 20 min of UE ergometry using SCIFit, level 4, manual program, maintaining rate of 40-50 watts.  Patient reports good challenge with exercise.   Therapeutic activities with emphasis on maintaining WBAT at R-LE, and challenges to dynamic sitting and standing balance at RW while playing Wii bowling game.   Patient reports fatigue near end of game due to numerous sit-stand (between each turn vs. OT).  Pain: 0/10; no pain  Therapy: Individual Therapy   Georgeanne Nim 08/26/2011, 3:33 PM

## 2011-08-27 NOTE — Progress Notes (Signed)
Physical Therapy Session Note  Patient Details  Name: Xavier Matthews MRN: [id number] Date of Birth: [birth year]  Today's Date: 08/27/2011 Time: 4098-1191 Time Calculation (min): 51 min  Short Term Goals: Week 1:  PT Short Term Goal 1 (Week 1): STG = LTG  Skilled Therapeutic Interventions/Progress Updates:   See below for details.   Therapy Documentation Precautions:  Precautions Precautions: Fall Required Braces or Orthoses: Other Brace/Splint (L Cam walker boot) Knee Immobilizer - Right: On at all times Other Brace/Splint: L camwalker boot Restrictions Weight Bearing Restrictions: Yes RLE Weight Bearing: Weight bearing as tolerated LLE Weight Bearing: Touchdown weight bearing Pain: No pain reported but when questioned about pain, pt stated that he hurts a lot. Mobility: Squat pivot transfers with close supervision.  Sit to stand with close supervision.   Locomotion :   Gait training with RW on unit x 80' x 2 with min@, pt maintaining wt bearing status Exercises:  NuStep x 15 min, workload on 7, not using LLE, for UE and RLE strengthening. See FIM for current functional status  Therapy/Group: Group Therapy Exercise Group  [name] 08/27/2011, 3:33 PM
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
## 2011-08-28 DIAGNOSIS — S329XXA Fracture of unspecified parts of lumbosacral spine and pelvis, initial encounter for closed fracture: Secondary | ICD-10-CM

## 2011-08-28 DIAGNOSIS — S82843A Displaced bimalleolar fracture of unspecified lower leg, initial encounter for closed fracture: Secondary | ICD-10-CM

## 2011-08-28 DIAGNOSIS — Z5189 Encounter for other specified aftercare: Secondary | ICD-10-CM

## 2011-08-28 LAB — URINALYSIS, ROUTINE W REFLEX MICROSCOPIC
Glucose, UA: NEGATIVE mg/dL
Leukocytes, UA: NEGATIVE
Protein, ur: NEGATIVE mg/dL
Specific Gravity, Urine: 1.012 (ref 1.005–1.030)
pH: 6.5 (ref 5.0–8.0)

## 2011-08-28 MED ORDER — OXYCODONE HCL 15 MG PO TB12
15.0000 mg | ORAL_TABLET | Freq: Two times a day (BID) | ORAL | Status: DC
  Administered 2011-08-28 – 2011-08-30 (×4): 15 mg via ORAL
  Filled 2011-08-28 (×4): qty 1

## 2011-08-28 NOTE — Progress Notes (Signed)
Physical Therapy Session Note  Patient Details  Name: Xavier Matthews MRN: [id number] Date of Birth: 1961-02-12  Today's Date: 08/28/2011 Time: 7829-5621 Time Calculation (min): 34 min  Short Term Goals: Week 1:  PT Short Term Goal 1 (Week 1): STG = LTG  Skilled Therapeutic Interventions/Progress Updates:   Pt requesting to do leg exercises and wanting to finish session in time to watch his show.  Therapy Documentation Precautions:  Precautions Precautions: Fall Required Braces or Orthoses: Other Brace/Splint (L CAM boot) Knee Immobilizer - Right: On at all times Other Brace/Splint: L camwalker boot Restrictions Weight Bearing Restrictions: Yes RLE Weight Bearing: Weight bearing as tolerated LLE Weight Bearing: Touchdown weight bearing Pain: Some grimacing with exercises, pain not rated.  Overall, pt smiling throughout session. Mobility:  Squat pivot transfers with supervision Exercises:  Squats with right LE and UEs x 20 reps.  Sitted SLR x 20 reps bilaterally, with 7 lb wt on right.  SAQ x 30 reps bilaterally.  Supine LLE supported on stool lifting onto mat x 10 reps.  Bridging on RLE in supine with foot placed on stool beside mat table.  Pt pulled therapist on stool x 200' for UE strengthening. See FIM for current functional status  Therapy/Group: Individual Therapy  [name] 08/28/2011, 3:08 PM

## 2011-08-28 NOTE — Progress Notes (Signed)
Social Work Patient ID: Xavier Matthews, male   DOB: 10-12-1960, 51 y.o.   MRN: [id number] Met with pt who had questions regarding SSD and Worker's Comp.  Informed him to discuss Worker's Comp with his Employer and will get him a SSD application.  Discussed not eligible for VAC in [place] due to self pay status.  If he stayed here In [place] could get [employer] to obtain one for him.  He plans to go to sister's home in [place].  Unsure if I can obtain any home health Follow up in [place] due to his self pay status.  Pt aware of my findings.

## 2011-08-28 NOTE — Progress Notes (Signed)
Physical Therapy Session Note  Patient Details  Name: Xavier Matthews MRN: [id number] Date of Birth: [birth year]  Today's Date: 08/28/2011 Time: 0900-1000 Time Calculation (min): 60 min  Short Term Goals: Week 1:  PT Short Term Goal 1 (Week 1): STG = LTG  Skilled Therapeutic Interventions/Progress Updates:    Patient is reaching goals quickly and will likely be able to D/C Wednesday instead of Friday if patient is able to learn how to perform wound care on his own.  Team and social work has been alerted; team is working and discussing how moving out of state and lack of financial resources will affect equipment and f/u therapy needs.  Will issue patient HEP  Therapy Documentation Precautions:  Precautions Precautions: Fall Required Braces or Orthoses: Other Brace/Splint (L CAM boot) Knee Immobilizer - Right: On at all times Other Brace/Splint: L camwalker boot Restrictions Weight Bearing Restrictions: Yes RLE Weight Bearing: Weight bearing as tolerated LLE Weight Bearing: Touchdown weight bearing Pain: Pain Assessment Pain Assessment: 0-10 Pain Score:   2 Pain Type: Acute pain Pain Location: Leg Pain Orientation: Right Pain Descriptors: Aching Pain Onset: With Activity Pain Intervention(s): Other (Comment) (RN made aware at end of session) Mobility:  Mat <> w/c transfers supervision with verbal cues to maintain WB status LLE; w/c mobility in controlled environment and w/c parts management mod I Locomotion : Ambulation Ambulation/Gait Assistance: 4: Min assist with rolling knee walker and then with RW x 50' with added knee sling to RW for WB through L thigh to assist with balance and endurance to decrease WB through UE; was able to maintain normal RR and HR did not elevate too high. Patient will not be able to afford to buy or rent knee walker and did not feel as comfortable with knee sling; removed sling and will continue to address gait training without sling. Stair training  with visual demonstration and verbalization of sequence first hopping up one step .backwards with RW and then sitting in w/c on "porch"; patient gave repeat demonstration x 2 reps with min A with verbal cues for safety and sequence and to maintain WB status. Exercises:  Bilat UE strengthening exercises with 2 sets x 12-15 reps each: bilat UE tricep and lat push ups with blocks, scap retraction with theraband, bicep curls, serratus punches  See FIM for current functional status  Therapy/Group: Individual Therapy  Edman Circle [facility] 08/28/2011, 12:17 PM

## 2011-08-28 NOTE — Progress Notes (Signed)
Occupational Therapy Session Note  Patient Details  Name: Xavier Matthews MRN: [id number] Date of Birth: 07-28-1960  Today's Date: 08/28/2011 Time: [phone] and 4782-9562 Time Calculation (min): 42 min and 45 min  Short Term Goals: Week 1:  OT Short Term Goal 1 (Week 1): STG = LTGs due to short LOS  Skilled Therapeutic Interventions/Progress Updates:    1) Pt seen for ADL retraining with focus on transfers, LB dressing, d/c planning, and weight bearing restrictions.  Pt with multiple questions regarding d/c including follow up therapies, wound care, and healing process.  Communicated with SW regarding pt's concerns.  Pt overall setup for bathing and dressing requiring the occasional cue for TDWB on LLE only.    2) 1:1 OT with focus on transfers, activity tolerance, and BUE/BLE strengthening.  Nustep on resistance 6 for 10 mins with just RLE and BUE for strength and conditioning.  Monitor of HR with activity with 115 and slight sweat.  10# bicep curls, chest press, and triceps 3 sets of 10.  Pt reports enjoying and feeling the benefit of strengthening UB and core and reports that it is assisting with his transfers.  Therapy Documentation Precautions:  Precautions Precautions: Fall Required Braces or Orthoses: Other Brace/Splint (L CAM boot) Knee Immobilizer - Right: On at all times Other Brace/Splint: L camwalker boot Restrictions Weight Bearing Restrictions: Yes RLE Weight Bearing: Weight bearing as tolerated LLE Weight Bearing: Touchdown weight bearing Pain: Pain Assessment Pain Assessment: 0-10 Pain Score:   2 Pain Type: Acute pain Pain Location: Leg Pain Orientation: Right Pain Descriptors: Aching Pain Onset: With Activity Pain Intervention(s): Other (Comment) (RN made aware at end of session)  See FIM for current functional status  Therapy/Group: Individual Therapy  [name] 08/28/2011, 12:26 PM
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
## 2011-08-28 NOTE — Consult Note (Addendum)
Wound care follow-up:  Vac dressing changed.  Remains with track towards 12 oclock unchanged in depth.  Large amt dark brown-red drainage in cannister and in wound bed during dressing change.  No odor, minimal discomfort when dressing changed. Applied white foam to tunnel, then black foam and track pad.  Barrier ring around site to maintain seal.  PA at bedside to assess and discuss packing wound when patient discharges home.  This would be appropriate to promote further wound healing. Previous cannister with foam in filter and not suctioning properly.  This could be a barrier to using Vac therapy. If it reoccurs, and packing wound would be next available option.  Plan dressing change Wed.  Cammie Mcgee, RN, MSN, [employer]  [phone]

## 2011-08-29 LAB — DIFFERENTIAL
Lymphocytes Relative: 19 % (ref 12–46)
Lymphs Abs: 1.9 10*3/uL (ref 0.7–4.0)
Neutro Abs: 6.8 10*3/uL (ref 1.7–7.7)
Neutrophils Relative %: 67 % (ref 43–77)

## 2011-08-29 LAB — CBC
Hemoglobin: 11 g/dL — ABNORMAL LOW (ref 13.0–17.0)
MCH: 27.7 pg (ref 26.0–34.0)
MCV: 83.1 fL (ref 78.0–100.0)
Platelets: 427 10*3/uL — ABNORMAL HIGH (ref 150–400)
Platelets: 464 10*3/uL — ABNORMAL HIGH (ref 150–400)
RBC: 3.53 MIL/uL — ABNORMAL LOW (ref 4.22–5.81)
RBC: 3.97 MIL/uL — ABNORMAL LOW (ref 4.22–5.81)
WBC: 10 10*3/uL (ref 4.0–10.5)

## 2011-08-29 LAB — BASIC METABOLIC PANEL
CO2: 27 mEq/L (ref 19–32)
Calcium: 9.5 mg/dL (ref 8.4–10.5)
Glucose, Bld: 93 mg/dL (ref 70–99)
Sodium: 131 mEq/L — ABNORMAL LOW (ref 135–145)

## 2011-08-29 NOTE — Progress Notes (Signed)
Recreational Therapy Discharge Summary Patient Details  Name: RAYE SLYTER MRN: [id number] Date of Birth: 1960/11/27 Today's Date: 08/29/2011  Long term goals set: 1  Long term goals met: 1  Comments on progress toward goals: Pt's discharge date moved up to tomorrow due to excellent progress toward goal.  Pt did not participate in formal TR session, but was observed at Mod I w/c level for TR tasks.  Pt ready for discharge home with sister.  Reasons for discharge: discharge from hospital  Patient/family agrees with progress made and goals achieved: Yes  Lorene Samaan 08/29/2011, 11:45 AM

## 2011-08-29 NOTE — Progress Notes (Signed)
Physical Therapy Discharge Summary  Patient Details  Name: Xavier Matthews MRN: [id number] Date of Birth: 08/17/1960  Today's Date: 08/29/2011 Time: 1045-1130 and 4098-1191 Time Calculation (min): 45 min and 40 min  Patient has made excellent progress toward PT goals and has met 11 of 11 long term goals due to improved activity tolerance, improved balance, increased strength and decreased pain.  Patient to discharge earlier than expected at mod I for w/c mobility in home and community environment and use of RW for household ambulation only level Modified Independent.   Patient's care partner is independent to provide the necessary supervision for stair negotiation with RW assistance at discharge.  Reasons goals not met: All goals met  Recommendation:  Patient will benefit from ongoing skilled PT services but secondary to patient being self pay unable to arrange f/u therapy at this time; patient given and educated on strengthening HEP that progresses from supine > sitting > standing as balance progresses and WB status are upgraded.   Once patient returns to [facility] and his WB status is upgraded he would benefit from outpatient PT to continue to advance safe functional mobility, address ongoing impairments in bilat LE strength, endurance, dynamic standing balance and gait to return to previous level of function, work hardening, and minimize fall risk.  Equipment: manual w/c, RW  Reasons for discharge: treatment goals met and discharge from hospital  Patient/family agrees with progress made and goals achieved: Yes  PT Discharge Precautions/Restrictions Restrictions Weight Bearing Restrictions: Yes RLE Weight Bearing: Weight bearing as tolerated LLE Weight Bearing: Touchdown weight bearing Pain Pain Assessment Pain Assessment: No/denies pain Pain Score:   2 Faces Pain Scale: Hurts a little bit Cognition Orientation Level: Oriented X4 Sensation  One area of lateral R knee numb but otherwise  [facility] Motor  Motor Motor: Within Functional Limits except for generalized LE weakness secondary to pain Mobility Bed Mobility Supine to Sit: 7: Independent Sit to Supine: 7: Independent Transfers Stand Pivot Transfers: 6: Modified independent (Device/Increase time) Stand Pivot Transfer Details (indicate cue type and reason): Stand pivot bed <> w/c with RW mod I; performed stand pivot and lateral hopping with RW for w/c <> car transfers with visual demonstration first and then patient giving repeat demonstration mod I with sister observing. Locomotion  Ambulation Ambulation/Gait Assistance: 6: Modified independent (Device/Increase time) x 150' with RW in controlled environment with improved upright posture and swing through of LLE Stairs / Additional Locomotion Stairs: Yes Stairs Assistance: 6: Modified independent (Device/Increase time) Stairs Assistance Details (indicate cue type and reason): Up and down one step backwards x 2 reps with RW mod I with sister observing technique and then sitting on "w/c" on simulated porch for home entry and exit at sister's house.   Stair Management Technique: No rails;Step to pattern;Backwards;With walker Number of Stairs: 2  Height of Stairs: 6  Wheelchair Mobility Wheelchair Mobility: Yes Wheelchair Assistance: 6: Modified independent (Device/Increase time) Occupational hygienist: Both upper extremities Wheelchair Parts Management: Independent Distance: 200 in controlled, home and community environment  Extremity Assessment   RLE: 4/5 throughout somewhat limited by pain in pelvis  LLE:  3/5 throughout; no resistance given; limited by weight of CAM boot and pain in pelvis    PM session: Received equipment for home; adjusted RW with front wheels higher than back legs to allow increased WB through posterior legs to prevent front wheels from sticking and pitching patient forward; ajusted w/c leg rests to appropriate length to minimize increased pressure on  pelvis.  Reviewed LE strengthening HEP with patient and had patient perform seated and supine exercises with RLE as tolerated by pain; as session continued patient began to c/o increased pelvic pain, ceased exercise and notified for pain meds and muscle relaxer    See FIM for current functional status  Edman Circle [facility] 08/29/2011, 11:43 AM
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
## 2011-08-29 NOTE — Progress Notes (Signed)
Social Work Patient ID: Xavier Matthews, male   DOB: 09-29-1960, 51 y.o.   MRN: [id number] Met with pt and sister who was here for family education.  Informed them have found a Home Health agency-[facility] to provide HHPT and HHRN.  His DME to be delivered today to his room.  Will assist with completion of SSD application and send in. Sister completed Hospital's financial form for assistance.  Discussed acquiring meds and handicapped sticker from [place].  Continue to work on discharge for  Tomorrow.

## 2011-08-29 NOTE — Progress Notes (Signed)
Occupational Therapy Session Note  Patient Details  Name: Xavier Matthews MRN: [id number] Date of Birth: 1960/05/25  Today's Date: 08/29/2011 Time: 0730-0810 and 1478-2956 Time Calculation (min): 40 min and 43 min  Short Term Goals: Week 1:  OT Short Term Goal 1 (Week 1): STG = LTGs due to short LOS  Skilled Therapeutic Interventions/Progress Updates:    1) Pt seen for ADL retraining at sink from w/c level.  Pt able to direct self-care and is supervision with LB dressing and mod I with bathing and UB dressing.  Pt asking questions regarding when he will be able to shower.  Reminded pt about TDWB status with LLE and the necessity to have tub bench for transfer secondary to weight bearing status and inability to step over tub ledge at this time in addition to staples and wound vac still in place.   2) 1:1 OT with focus on transfers in bathroom from w/c and with RW in case bathroom inaccessible from w/c.  Pt mod I with transfers both from w/c level and RW level.  Pt demonstrated tub/shower transfer with use of tubbench but reports that he plans to sponge bathe until able to bear weight through LLE.  Engaged in simple meal prep with gathering items from cabinets and fridge from w/c and RW level, pt doesn't plan to do any cooking until he is on his own again.  Ther ex with chest presses and rowing with 3 sets of 20 with 25#.  BUE strengthening to assist with transfers.  Therapy Documentation Precautions:  Precautions Precautions: Fall Required Braces or Orthoses: Other Brace/Splint (L CAM boot) Knee Immobilizer - Right: On at all times Other Brace/Splint: L camwalker boot Restrictions Weight Bearing Restrictions: Yes RLE Weight Bearing:  (WBAT) LLE Weight Bearing: Touchdown weight bearing General:   Vital Signs: Therapy Vitals Temp: 99.3 F (37.4 C) Temp src: Oral Pulse Rate: 97  Resp: 19  BP: 119/70 mmHg Patient Position, if appropriate: Lying Oxygen Therapy SpO2: 99 % O2 Device:  None (Room air) Pain: Pain Assessment Pain Assessment: 0-10 Pain Score:   4 Pain Type: Acute pain Pain Location: Pelvis Pain Descriptors: Aching Pain Intervention(s): RN made aware  See FIM for current functional status  Therapy/Group: Individual Therapy  [name] 08/29/2011, 8:10 AM

## 2011-08-29 NOTE — Progress Notes (Signed)
Occupational Therapy Discharge Summary  Patient Details  Name: Xavier Matthews MRN: [id number] Date of Birth: 1960/07/05  Today's Date: 08/29/2011  Patient has met 6 of 6 long term goals due to improved activity tolerance, ability to compensate for deficits and improved awareness.  Patient to discharge at overall Modified Independent level.  Patient is discharging to sister's home, she has not been present for any OT session, but has attended PT sessions and gone through transfer training. Pt may benefit from occasional cues for TDWB status on LLE prior to ambulation.  Reasons goals not met: N/A  Recommendation:  Patient will not require follow up OT at this time.  Equipment: No equipment provided  Reasons for discharge: treatment goals met and discharge from hospital  Patient/family agrees with progress made and goals achieved: Yes  OT Discharge Precautions/Restrictions   TDWB on LLE WBAT on RLE Pain Pain Assessment Pain Assessment: No/denies pain Pain Score:   2 Faces Pain Scale: Hurts a little bit ADL ADL Eating: Independent Grooming: Independent Where Assessed-Grooming: Sitting at sink Upper Body Bathing: Independent Where Assessed-Upper Body Bathing: Sitting at sink Lower Body Bathing: Modified independent Where Assessed-Lower Body Bathing: Sitting at sink Upper Body Dressing: Independent Where Assessed-Upper Body Dressing: Sitting at sink Lower Body Dressing: Modified independent Where Assessed-Lower Body Dressing: Edge of bed Toileting: Modified independent Where Assessed-Toileting: Neurosurgeon Method: Ambulance person: Engineer, technical sales: Close supervison Web designer Method: Ship broker: Emergency planning/management officer Vision/Perception  Vision - History Baseline Vision: Wears glasses only for reading Patient Visual Report: No change from baseline Vision -  Assessment Eye Alignment: Within Functional Limits Perception Perception: Within Functional Limits Praxis Praxis: Intact  Cognition Overall Cognitive Status: Appears within functional limits for tasks assessed Arousal/Alertness: Awake/alert Orientation Level: Oriented X4 Sensation Sensation Light Touch: Appears Intact Hot/Cold: Appears Intact Proprioception: Appears Intact Coordination Fine Motor Movements are Fluid and Coordinated: Yes Finger Nose Finger Test: [facility] Motor  Motor Motor: Within Functional Limits Mobility  Bed Mobility Supine to Sit: 7: Independent Sit to Supine: 7: Independent  Extremity/Trunk Assessment RUE Assessment RUE Assessment: Within Functional Limits LUE Assessment LUE Assessment: Within Functional Limits  See FIM for current functional status  Xavier Matthews 08/29/2011, 1:10 PM

## 2011-08-30 ENCOUNTER — Inpatient Hospital Stay (HOSPITAL_COMMUNITY): Payer: No Typology Code available for payment source

## 2011-08-30 MED ORDER — CYCLOBENZAPRINE HCL 5 MG PO TABS
5.0000 mg | ORAL_TABLET | Freq: Three times a day (TID) | ORAL | Status: DC
Start: 2011-08-30 — End: 2011-09-01
  Administered 2011-08-30 – 2011-09-01 (×4): 5 mg via ORAL
  Filled 2011-08-30 (×8): qty 1

## 2011-08-30 MED ORDER — POLYSACCHARIDE IRON COMPLEX 150 MG PO CAPS: 150.0000 mg | ORAL_CAPSULE | Freq: Two times a day (BID) | ORAL | Status: AC

## 2011-08-30 MED ORDER — CIPROFLOXACIN HCL 500 MG PO TABS: 500.0000 mg | ORAL_TABLET | Freq: Two times a day (BID) | ORAL | Status: AC

## 2011-08-30 MED ORDER — MORPHINE SULFATE 15 MG PO TABS: 15.0000 mg | ORAL_TABLET | Freq: Three times a day (TID) | ORAL | Status: AC | PRN

## 2011-08-30 MED ORDER — POLYETHYLENE GLYCOL 3350 17 G PO PACK: 17.0000 g | PACK | Freq: Every day | ORAL | Status: AC

## 2011-08-30 MED ORDER — IOHEXOL 300 MG/ML  SOLN
20.0000 mL | INTRAMUSCULAR | Status: AC
Start: 2011-08-30 — End: 2011-08-30
  Administered 2011-08-30 (×2): 20 mL via ORAL

## 2011-08-30 MED ORDER — IOHEXOL 300 MG/ML  SOLN
75.0000 mL | Freq: Once | INTRAMUSCULAR | Status: AC | PRN
  Administered 2011-08-30: 75 mL via INTRAVENOUS

## 2011-08-30 MED ORDER — TRAMADOL HCL 50 MG PO TABS: ORAL_TABLET | ORAL | Status: AC

## 2011-08-30 MED ORDER — OXYCODONE HCL 15 MG PO TB12: 15.0000 mg | ORAL_TABLET | Freq: Two times a day (BID) | ORAL | Status: DC

## 2011-08-30 MED ORDER — DSS 100 MG PO CAPS: 100.0000 mg | ORAL_CAPSULE | Freq: Two times a day (BID) | ORAL | Status: AC

## 2011-08-30 MED ORDER — MORPHINE SULFATE 15 MG PO TABS
15.0000 mg | ORAL_TABLET | Freq: Three times a day (TID) | ORAL | Status: DC | PRN
  Administered 2011-08-30 – 2011-08-31 (×2): 15 mg via ORAL
  Filled 2011-08-30 (×2): qty 1

## 2011-08-30 MED ORDER — CIPROFLOXACIN HCL 500 MG PO TABS
500.0000 mg | ORAL_TABLET | Freq: Two times a day (BID) | ORAL | Status: DC
  Administered 2011-08-30 (×2): 500 mg via ORAL
  Filled 2011-08-30 (×6): qty 1

## 2011-08-30 MED ORDER — OXYCODONE HCL 10 MG PO TABS: 10.0000 mg | ORAL_TABLET | Freq: Four times a day (QID) | ORAL | Status: DC | PRN

## 2011-08-30 MED ORDER — CYCLOBENZAPRINE HCL 5 MG PO TABS: 5.0000 mg | ORAL_TABLET | Freq: Three times a day (TID) | ORAL | Status: DC

## 2011-08-30 MED ORDER — CYCLOBENZAPRINE HCL 5 MG PO TABS: 5.0000 mg | ORAL_TABLET | Freq: Three times a day (TID) | ORAL | Status: AC

## 2011-08-30 MED ORDER — MORPHINE SULFATE ER 15 MG PO TBCR: 15.0000 mg | EXTENDED_RELEASE_TABLET | Freq: Two times a day (BID) | ORAL | Status: AC

## 2011-08-30 MED ORDER — MORPHINE SULFATE ER 15 MG PO TBCR
15.0000 mg | EXTENDED_RELEASE_TABLET | Freq: Two times a day (BID) | ORAL | Status: DC
  Administered 2011-08-30 – 2011-09-01 (×3): 15 mg via ORAL
  Filled 2011-08-30 (×3): qty 1

## 2011-08-30 NOTE — Patient Care Conference (Signed)
Inpatient RehabilitationTeam Conference Note Date: 08/30/2011   Time: 10:30 AM    Patient Name: Xavier Matthews      Medical Record Number: [id number]  Date of Birth: 1960/09/16 Sex: Male         Room/Bed: 4007/4007-01 Payor Info: Payor: MED PAY  Plan: MED PAY ASSURANCE  Product Type: *No Product type*     Admitting Diagnosis: L pubic rami Fx, L ankle fx  Admit Date/Time:  08/22/2011  5:19 PM Admission Comments: No comment available   Primary Diagnosis:  Trauma Principal Problem: Trauma  Patient Active Problem List  Diagnoses Date Noted  . Trauma 08/23/2011  . [facility] 08/17/2011  . Penile lacerations 08/17/2011  . Scrotal laceration 08/17/2011  . Concussion 08/17/2011  . Right tib/fib fractures 08/17/2011  . Left ankle fracture 08/17/2011  . Open bilateral superior pubic rami fractures 08/17/2011  . Acute blood loss anemia 08/17/2011    Expected Discharge Date: Expected Discharge Date: 08/30/11 (if abd. CT is negative)  Team Members Present: Physician: Dr. Claudette Laws Case Manager Present: Melanee Spry, RN Social Worker Present: Dossie Der, LCSW Nurse Present: Rosalio Macadamia, RN PT Present: Edman Circle, PT OT Present: [name], Felipa Eth, OT SLP Present: Fae Pippin, SLP     Current Status/Progress Goal Weekly Team Focus  Medical   ankle pain is improved now with more left-sided pelvic and groin pain.  recheck CT of pelvis  assess medical stability for discharge to home   Bowel/Bladder   N/A         Swallow/Nutrition/ Hydration             ADL's   mod I overall, benefits from reminders for TDWB on LLE  mod I overall  d/c planning   Mobility             Communication             Safety/Cognition/ Behavioral Observations  n/a         Pain   Scheduled Oxycondone 15mg , Robaxin 500-1000mg , Ultram 100 PRN Oxycodone 10-15mg  q3hrs as need for pain  Pain level less than or equal to 3  Assess pain level q shift and prn. Monitor for sign and symptoms of pain     Skin   Wound vac to left groin, sutures to tip of penis, staples to right ankle, mepilex on right lower leg for abrasion, ace wrap on left leg   No new skin breakdown/ infection  Assess skin daily for signs and symptoms of breakdown      *See Interdisciplinary Assessment and Plan and progress notes for long and short-term goals  Barriers to Discharge: see above    Possible Resolutions to Barriers:  see above    Discharge Planning/Teaching Needs:  Home with sister, family education completed yesterday. Found a HH agency to provide RN and PT follow up with self pay status. Set for discharge      Team Discussion: New wound care to be taught to pt/family today--pt to have CT of abdomen today--it must be negative for pt to d/c today.   Revisions to Treatment Plan: none    Continued Need for Acute Rehabilitation Level of Care: The patient requires daily medical management by a physician with specialized training in physical medicine and rehabilitation for the following conditions: Daily direction of a multidisciplinary physical rehabilitation program to ensure safe treatment while eliciting the highest outcome that is of practical value to the patient.: Yes Daily medical management of patient stability for  increased activity during participation in an intensive rehabilitation regime.: Yes Daily analysis of laboratory values and/or radiology reports with any subsequent need for medication adjustment of medical intervention for : Post surgical problems;Other  Brock Ra 08/30/2011, 1:29 PM

## 2011-08-30 NOTE — Progress Notes (Signed)
Patient with fluid collection on CT. Dr. Laverle Patter reviewed films and doesn't feel GU involvement  And recommends trauma's input.  Discussed results with Trauma PA who relayed that Dr. Dwain Sarna would like to review films and to hold discharge for now. Patient and family informed of ongoing issues. Will re-evaluate in am.

## 2011-08-30 NOTE — Consult Note (Signed)
Wound care follow-up:  Full Thickness penile laceration. Discontinue wound vac for discharge to home.  Wound measures 2.5 x 1.2 x 4.0 cm. Wound bed pink and moist in appearance.  Continues to have a large amount of serosanguinous drainage from wound.   Sister at bedside.  Teaching of how to pack wound demonstrated to wife and patient. Assisted patient in packing wound.  Tolerated dressing change well without pain. Plans to discharge home today.  Nefeteria Jeter RN, BSN, WOC Nurse/Will not plan to follow further unless re-consulted.  [address] RN, MSN, [employer]  [phone]
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
## 2011-08-31 DIAGNOSIS — S329XXA Fracture of unspecified parts of lumbosacral spine and pelvis, initial encounter for closed fracture: Secondary | ICD-10-CM

## 2011-08-31 DIAGNOSIS — S82843A Displaced bimalleolar fracture of unspecified lower leg, initial encounter for closed fracture: Secondary | ICD-10-CM

## 2011-08-31 DIAGNOSIS — Z5189 Encounter for other specified aftercare: Secondary | ICD-10-CM

## 2011-08-31 DIAGNOSIS — IMO0002 Reserved for concepts with insufficient information to code with codable children: Secondary | ICD-10-CM

## 2011-08-31 LAB — CBC
HCT: 29 % — ABNORMAL LOW (ref 39.0–52.0)
MCH: 27.7 pg (ref 26.0–34.0)
MCHC: 33.4 g/dL (ref 30.0–36.0)
MCV: 82.9 fL (ref 78.0–100.0)
RDW: 13.2 % (ref 11.5–15.5)

## 2011-08-31 LAB — DIFFERENTIAL
Basophils Absolute: 0.1 10*3/uL (ref 0.0–0.1)
Basophils Relative: 1 % (ref 0–1)
Eosinophils Absolute: 0.2 10*3/uL (ref 0.0–0.7)
Eosinophils Relative: 2 % (ref 0–5)
Monocytes Absolute: 1.5 10*3/uL — ABNORMAL HIGH (ref 0.1–1.0)

## 2011-08-31 LAB — URINE CULTURE: Colony Count: 80000

## 2011-08-31 MED ORDER — CIPROFLOXACIN HCL 500 MG PO TABS
500.0000 mg | ORAL_TABLET | Freq: Two times a day (BID) | ORAL | Status: DC
  Administered 2011-08-31 – 2011-09-01 (×3): 500 mg via ORAL
  Filled 2011-08-31 (×5): qty 1

## 2011-08-31 MED ORDER — CIPROFLOXACIN IN D5W 400 MG/200ML IV SOLN
400.0000 mg | Freq: Two times a day (BID) | INTRAVENOUS | Status: DC
  Filled 2011-08-31 (×2): qty 200
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
## 2011-08-31 NOTE — Progress Notes (Signed)
Patient ID: Xavier Matthews, male   DOB: 10/28/1960, 51 y.o.   MRN: [id number] I reviewed this case with Dr. Fredia Sorrow  From IR.  The fluid in the collection looks serous.  It tracks right to the open wound.  He recommends continued packing/dressing changes and re-imaging if needed (does not clinically improve).  I will D/W Rinaldo Cloud on the CIR team. Violeta Gelinas, MD, MPH, FACS Pager: [phone]

## 2011-08-31 NOTE — Progress Notes (Signed)
Patient ID: SHELLY SHOULTZ, male   DOB: November 12, 1960, 51 y.o.   MRN: [id number] CT findings noted and I discussed with Vertell Novak PA, and my partner Dr. Dwain Sarna.  Today I removed the packing on the upper scrotal wound.  It does track deep and up to the symphysis.  I obtained a lot of serous drainage.  I am concerned this communicates to his pelvic fractures.  I spoke to Dr. Ave Filter from orthopedics.  He suggested calling Dr. Laverle Patter who managed this wound.  CIR team contacted Dr. Laverle Patter yesterday and he told them to call us.  I am not sure the area is adequately drained so I will review the case with IR to see if percutaneous drainage would help.  Agree with ABX. Violeta Gelinas, MD, MPH, FACS Pager: [phone]

## 2011-09-01 DIAGNOSIS — S329XXA Fracture of unspecified parts of lumbosacral spine and pelvis, initial encounter for closed fracture: Secondary | ICD-10-CM

## 2011-09-01 DIAGNOSIS — S82843A Displaced bimalleolar fracture of unspecified lower leg, initial encounter for closed fracture: Secondary | ICD-10-CM

## 2011-09-01 DIAGNOSIS — Z5189 Encounter for other specified aftercare: Secondary | ICD-10-CM

## 2011-09-01 LAB — DIFFERENTIAL
Basophils Absolute: 0.1 10*3/uL (ref 0.0–0.1)
Basophils Relative: 1 % (ref 0–1)
Eosinophils Relative: 3 % (ref 0–5)
Monocytes Absolute: 1.3 10*3/uL — ABNORMAL HIGH (ref 0.1–1.0)

## 2011-09-01 LAB — CBC
HCT: 29.8 % — ABNORMAL LOW (ref 39.0–52.0)
MCH: 27.3 pg (ref 26.0–34.0)
MCHC: 32.9 g/dL (ref 30.0–36.0)
MCV: 83 fL (ref 78.0–100.0)
RDW: 13.1 % (ref 11.5–15.5)
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
## 2011-09-01 NOTE — Progress Notes (Signed)
Pt. Discharge to home with Sister @ 1320; escorted by NT.  All personal belongings in tow.  PA, Pam Love provided discharge instructions via handouts.  No further questions asked.

## 2011-09-01 NOTE — Progress Notes (Signed)
Social Work Discharge Note Discharge Note  The overall goal for the admission was met for:   Discharge location: Yes-HOME WITH SISTER IN [place]  Length of Stay: Yes-10 DAYS  Discharge activity level: Yes-SUPERVISION/MIN ASSIST  Home/community participation: Yes  Services provided included: MD, RD, PT, OT, RN, CM, TR, Pharmacy and SW  Financial Services: Other: PENDING MEDICAID  Follow-up services arranged: Home Health: [facility]-PT, RN, DME: [facility]-WHEELCHAIR, BSC and Patient/Family has no preference for HH/DME agencies  Comments (or additional information):SISTER WENT THRU FAMILY EDUCATION, PT AND SISTER TO FOLLOW UP WITH [employer] APPLICATION AWARE OF THE COMMUNITY RESOURCES-IE FOOD STAMPS, ETC  Patient/Family verbalized understanding of follow-up arrangements: Yes  Individual responsible for coordination of the follow-up plan: LISA-SISTER  Confirmed correct DME delivered: [name] 09/01/2011    [name]

## 2011-09-01 NOTE — Discharge Instructions (Signed)
Inpatient Rehab Discharge Instructions  Xavier Matthews Discharge date and time: 09/01/11   Activities/Precautions/ Functional Status: Activity: activity as tolerated. TOUCH DOWN WEIGHT WITH BOOT ON LEFT LEG. Diet: regular diet Wound Care: Cleanse area  around with soap and water and pat dry.  Pack wound with idodoform guaze (wick)--make sure to get to the bottom--  and cover with  a pad.  Change dressing twice a day.    Functional status:  ___ No restrictions     ___ Walk up steps independently ___ 24/7 supervision/assistance   ___ Walk up steps with assistance _X__ Intermittent supervision/assistance  ___ Bathe/dress independently _X__ Walk with walker    ___ Bathe/dress with assistance ___ Walk Independently    ___ Shower independently ___ Walk with assistance    ___ Shower with assistance ___ No alcohol     ___ Return to work/school ________  Special Instructions: 1. NO ALCOHOL 2. NO SMOKING 3. CALL MD IF YOU DEVELOP FEVERS, CHILLS, PURULENT DRAINAGE OR REDNESS AROUND THE WOUND.  COMMUNITY REFERRALS UPON DISCHARGE:   [facility]   [phone] PT AND RN   Medical Equipment/Items Ordered:WHEELCHAIR,  [name]  Agency/Supplier:[facility]  [phone]  SISTER AND PT TO FOLLOW UP WITH SSD AND MEDICAID APPLICATION     My questions have been answered and I understand these instructions. I will adhere to these goals and the provided educational materials after my discharge from the hospital.  Patient/Caregiver Signature _______________________________ Date __________  Clinician Signature _______________________________________ Date __________  Please bring this form and your medication list with you to all your follow-up doctor's appointments.

## 2011-09-01 NOTE — Progress Notes (Signed)
S: Doing very well, walking frequently, both legs are feeling good with minimal pain.  Called to see patient re: increased WBC with c/o open wound communicating with pelvic fractures.  No significant pelvic pain at this time.  Had wound VAC to this area until earlier this week and now has iodoform packing.  Dr. Janee Morn explored at bedside and was able to release some serous benign appearing fluid.  Also recently dx with UTI and being treated with Cipro.  WBC has come down.  O: AFVSS. WBC currently normalized. Exam of anterior pelvis shows ~15 x 20 mm opening which is benign appearing, no purulence.  This probes deeply to symphysis, no further fluid collections.  Repacked with iodoform guaze. Bilateral lower extremities incisions all healed. Staples out except distal R leg.  Knee and ankle ROM excellent.  No calf TTP or swelling.  NVID.  A/P: BLEs doing very well, continue WBAT on RLE and TDWB on LLE.  Follow up in my office 2 wks.  [id number]. This is the first time I have been involved in the care of the pelvic wound (initially washed out by Dr. Laverle Patter and subsequently managed by trauma svc with VAC dressing).  This is not ideal because it communicates deeply with pelvic fractures and puts him at rist for osteomyelitis.  At this point I do not see any further surgical indication as it is adequately draining and has no current signs of infection.  Would continue packing to keep open and allow drainage and allow healing from bottom up.
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
## 2011-09-18 ENCOUNTER — Telehealth: Payer: Self-pay | Admitting: Orthopedic Surgery

## 2011-09-18 NOTE — Telephone Encounter (Signed)
Left message. Possibly a wrong number as I have a written message to call with number being [phone]. That number was currently not in service.

## 2011-09-18 NOTE — Telephone Encounter (Signed)
Left message

## 2011-09-19 ENCOUNTER — Telehealth: Payer: Self-pay | Admitting: Orthopedic Surgery

## 2011-09-19 NOTE — Telephone Encounter (Signed)
Patient called for an appointment for his wound. He said Dr. Ave Filter had referred him back to the trauma clinic. He said he was worried because the wound was healing quickly and the "tissue was hanging out".   I called Dr. Veda Canning office; he is out this week. The last note said to continue packing and return in 4 weeks. I wonder if the tissue he's referring to is the packing, and I wonder if that means he's not changing it. In any event, it seems he doesn't need an appointment with the trauma clinic. I suggested someone from Dr. Veda Canning office call him to sort out what his concerns are.

## 2011-12-04 ENCOUNTER — Ambulatory Visit: Payer: Self-pay | Attending: Orthopedic Surgery | Admitting: Physical Therapy

## 2011-12-04 ENCOUNTER — Ambulatory Visit: Payer: Self-pay | Admitting: Physical Therapy

## 2011-12-04 DIAGNOSIS — M6281 Muscle weakness (generalized): Secondary | ICD-10-CM | POA: Insufficient documentation

## 2011-12-04 DIAGNOSIS — M2560 Stiffness of unspecified joint, not elsewhere classified: Secondary | ICD-10-CM | POA: Insufficient documentation

## 2011-12-04 DIAGNOSIS — IMO0001 Reserved for inherently not codable concepts without codable children: Secondary | ICD-10-CM | POA: Insufficient documentation

## 2011-12-04 DIAGNOSIS — M2550 Pain in unspecified joint: Secondary | ICD-10-CM | POA: Insufficient documentation

## 2011-12-04 DIAGNOSIS — R262 Difficulty in walking, not elsewhere classified: Secondary | ICD-10-CM | POA: Insufficient documentation

## 2011-12-12 ENCOUNTER — Ambulatory Visit: Payer: Self-pay

## 2011-12-18 ENCOUNTER — Ambulatory Visit: Payer: Self-pay

## 2011-12-20 ENCOUNTER — Ambulatory Visit: Payer: Self-pay | Admitting: Physical Therapy

## 2011-12-25 ENCOUNTER — Ambulatory Visit: Payer: Self-pay

## 2012-01-08 ENCOUNTER — Ambulatory Visit (HOSPITAL_COMMUNITY)
Admission: RE | Admit: 2012-01-08 | Discharge: 2012-01-08 | Disposition: A | Discharge status: Home / Self Care | Payer: Self-pay | Source: Ambulatory Visit | Attending: Physical Medicine & Rehabilitation | Admitting: Physical Medicine & Rehabilitation

## 2012-01-08 DIAGNOSIS — IMO0001 Reserved for inherently not codable concepts without codable children: Secondary | ICD-10-CM | POA: Insufficient documentation

## 2012-01-08 DIAGNOSIS — R262 Difficulty in walking, not elsewhere classified: Secondary | ICD-10-CM | POA: Insufficient documentation

## 2012-01-08 DIAGNOSIS — M25579 Pain in unspecified ankle and joints of unspecified foot: Secondary | ICD-10-CM | POA: Insufficient documentation

## 2012-01-08 DIAGNOSIS — M6281 Muscle weakness (generalized): Secondary | ICD-10-CM | POA: Insufficient documentation

## 2012-01-08 DIAGNOSIS — R29898 Other symptoms and signs involving the musculoskeletal system: Secondary | ICD-10-CM | POA: Insufficient documentation

## 2012-01-08 DIAGNOSIS — M25559 Pain in unspecified hip: Secondary | ICD-10-CM | POA: Insufficient documentation
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
## 2012-01-11 ENCOUNTER — Ambulatory Visit (HOSPITAL_COMMUNITY)
Admission: RE | Admit: 2012-01-11 | Discharge: 2012-01-11 | Disposition: A | Discharge status: Home / Self Care | Payer: Self-pay | Source: Ambulatory Visit | Attending: Physical Medicine & Rehabilitation | Admitting: Physical Medicine & Rehabilitation
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
## 2012-01-15 ENCOUNTER — Ambulatory Visit (HOSPITAL_COMMUNITY): Payer: Self-pay | Admitting: *Deleted

## 2012-01-18 ENCOUNTER — Ambulatory Visit (HOSPITAL_COMMUNITY)
Admission: RE | Admit: 2012-01-18 | Payer: Self-pay | Source: Ambulatory Visit | Attending: Physical Medicine & Rehabilitation | Admitting: Physical Medicine & Rehabilitation

## 2012-01-22 ENCOUNTER — Ambulatory Visit (HOSPITAL_COMMUNITY)
Admission: RE | Admit: 2012-01-22 | Discharge: 2012-01-22 | Disposition: A | Discharge status: Home / Self Care | Payer: Self-pay | Source: Ambulatory Visit | Attending: Physical Medicine & Rehabilitation | Admitting: Physical Medicine & Rehabilitation

## 2012-01-22 ENCOUNTER — Ambulatory Visit (HOSPITAL_COMMUNITY): Payer: Self-pay | Admitting: *Deleted
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
## 2012-01-25 ENCOUNTER — Ambulatory Visit (HOSPITAL_COMMUNITY): Payer: Self-pay | Admitting: *Deleted

## 2012-01-29 ENCOUNTER — Ambulatory Visit (HOSPITAL_COMMUNITY): Payer: Self-pay | Admitting: Physical Therapy

## 2012-02-01 ENCOUNTER — Ambulatory Visit (HOSPITAL_COMMUNITY)
Admission: RE | Admit: 2012-02-01 | Discharge: 2012-02-01 | Disposition: A | Discharge status: Home / Self Care | Payer: Self-pay | Source: Ambulatory Visit | Attending: Physical Medicine & Rehabilitation | Admitting: Physical Medicine & Rehabilitation

## 2012-02-01 DIAGNOSIS — M25559 Pain in unspecified hip: Secondary | ICD-10-CM | POA: Insufficient documentation

## 2012-02-01 DIAGNOSIS — M6281 Muscle weakness (generalized): Secondary | ICD-10-CM | POA: Insufficient documentation

## 2012-02-01 DIAGNOSIS — R262 Difficulty in walking, not elsewhere classified: Secondary | ICD-10-CM | POA: Insufficient documentation

## 2012-02-01 DIAGNOSIS — M25579 Pain in unspecified ankle and joints of unspecified foot: Secondary | ICD-10-CM | POA: Insufficient documentation

## 2012-02-01 DIAGNOSIS — R29898 Other symptoms and signs involving the musculoskeletal system: Secondary | ICD-10-CM

## 2012-02-01 DIAGNOSIS — IMO0001 Reserved for inherently not codable concepts without codable children: Secondary | ICD-10-CM | POA: Insufficient documentation
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
## 2012-02-06 ENCOUNTER — Ambulatory Visit (HOSPITAL_COMMUNITY): Payer: Self-pay | Admitting: *Deleted

## 2012-02-12 ENCOUNTER — Inpatient Hospital Stay (HOSPITAL_COMMUNITY): Admission: RE | Admit: 2012-02-12 | Payer: Self-pay | Source: Ambulatory Visit | Admitting: *Deleted

## 2012-02-27 ENCOUNTER — Ambulatory Visit (HOSPITAL_COMMUNITY)
Admission: RE | Admit: 2012-02-27 | Discharge: 2012-02-27 | Disposition: A | Discharge status: Home / Self Care | Payer: Self-pay | Source: Ambulatory Visit | Attending: Physical Medicine & Rehabilitation | Admitting: Physical Medicine & Rehabilitation

## 2012-02-27 DIAGNOSIS — R262 Difficulty in walking, not elsewhere classified: Secondary | ICD-10-CM | POA: Insufficient documentation

## 2012-02-27 DIAGNOSIS — M25579 Pain in unspecified ankle and joints of unspecified foot: Secondary | ICD-10-CM | POA: Insufficient documentation

## 2012-02-27 DIAGNOSIS — M6281 Muscle weakness (generalized): Secondary | ICD-10-CM | POA: Insufficient documentation

## 2012-02-27 DIAGNOSIS — IMO0001 Reserved for inherently not codable concepts without codable children: Secondary | ICD-10-CM | POA: Insufficient documentation

## 2012-02-27 DIAGNOSIS — M25559 Pain in unspecified hip: Secondary | ICD-10-CM | POA: Insufficient documentation
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
## 2012-03-05 ENCOUNTER — Ambulatory Visit (HOSPITAL_COMMUNITY): Payer: Self-pay | Admitting: *Deleted

## 2012-03-06 ENCOUNTER — Ambulatory Visit (HOSPITAL_COMMUNITY)
Admission: RE | Admit: 2012-03-06 | Discharge: 2012-03-06 | Disposition: A | Discharge status: Home / Self Care | Payer: Self-pay | Source: Ambulatory Visit | Attending: Physical Medicine & Rehabilitation | Admitting: Physical Medicine & Rehabilitation
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
## 2012-03-07 ENCOUNTER — Encounter (HOSPITAL_BASED_OUTPATIENT_CLINIC_OR_DEPARTMENT_OTHER): Payer: Self-pay

## 2012-03-07 ENCOUNTER — Ambulatory Visit (HOSPITAL_COMMUNITY)
Admission: RE | Admit: 2012-03-07 | Discharge: 2012-03-07 | Disposition: A | Discharge status: Home / Self Care | Payer: Self-pay | Source: Ambulatory Visit | Attending: Physical Medicine & Rehabilitation | Admitting: Physical Medicine & Rehabilitation
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
## 2012-03-13 ENCOUNTER — Ambulatory Visit (HOSPITAL_COMMUNITY)
Admission: RE | Admit: 2012-03-13 | Discharge: 2012-03-13 | Disposition: A | Discharge status: Home / Self Care | Payer: Self-pay | Source: Ambulatory Visit | Attending: Physical Medicine & Rehabilitation | Admitting: Physical Medicine & Rehabilitation

## 2012-03-14 ENCOUNTER — Ambulatory Visit (HOSPITAL_COMMUNITY): Payer: Self-pay | Admitting: *Deleted

## 2012-03-15 ENCOUNTER — Ambulatory Visit (HOSPITAL_COMMUNITY)
Admission: RE | Admit: 2012-03-15 | Discharge: 2012-03-15 | Disposition: A | Discharge status: Home / Self Care | Payer: Self-pay | Source: Ambulatory Visit | Attending: Physical Medicine & Rehabilitation | Admitting: Physical Medicine & Rehabilitation
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
## 2012-03-18 ENCOUNTER — Ambulatory Visit (HOSPITAL_COMMUNITY): Payer: Self-pay | Admitting: Physical Therapy

## 2012-03-22 ENCOUNTER — Telehealth (HOSPITAL_COMMUNITY): Payer: Self-pay

## 2012-03-22 ENCOUNTER — Inpatient Hospital Stay (HOSPITAL_COMMUNITY): Admission: RE | Admit: 2012-03-22 | Payer: Self-pay | Source: Ambulatory Visit

## 2012-03-26 ENCOUNTER — Inpatient Hospital Stay (HOSPITAL_COMMUNITY): Admission: RE | Admit: 2012-03-26 | Payer: Self-pay | Source: Ambulatory Visit | Admitting: *Deleted

## 2012-03-29 ENCOUNTER — Ambulatory Visit (HOSPITAL_COMMUNITY)
Admission: RE | Admit: 2012-03-29 | Discharge: 2012-03-29 | Disposition: A | Discharge status: Home / Self Care | Payer: Self-pay | Source: Ambulatory Visit | Attending: Physical Medicine & Rehabilitation | Admitting: Physical Medicine & Rehabilitation

## 2012-03-29 DIAGNOSIS — R262 Difficulty in walking, not elsewhere classified: Secondary | ICD-10-CM | POA: Insufficient documentation

## 2012-03-29 DIAGNOSIS — M25579 Pain in unspecified ankle and joints of unspecified foot: Secondary | ICD-10-CM | POA: Insufficient documentation

## 2012-03-29 DIAGNOSIS — IMO0001 Reserved for inherently not codable concepts without codable children: Secondary | ICD-10-CM | POA: Insufficient documentation

## 2012-03-29 DIAGNOSIS — M6281 Muscle weakness (generalized): Secondary | ICD-10-CM | POA: Insufficient documentation

## 2012-03-29 DIAGNOSIS — M25559 Pain in unspecified hip: Secondary | ICD-10-CM | POA: Insufficient documentation
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
## 2012-04-02 ENCOUNTER — Ambulatory Visit (HOSPITAL_COMMUNITY): Payer: Self-pay | Admitting: Physical Therapy

## 2012-04-04 ENCOUNTER — Encounter (HOSPITAL_BASED_OUTPATIENT_CLINIC_OR_DEPARTMENT_OTHER): Payer: Self-pay

## 2012-04-05 ENCOUNTER — Ambulatory Visit (HOSPITAL_COMMUNITY)
Admission: RE | Admit: 2012-04-05 | Discharge: 2012-04-05 | Disposition: A | Discharge status: Home / Self Care | Payer: Self-pay | Source: Ambulatory Visit | Attending: Physical Medicine & Rehabilitation | Admitting: Physical Medicine & Rehabilitation
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
## 2012-04-08 ENCOUNTER — Ambulatory Visit (HOSPITAL_COMMUNITY): Payer: Self-pay | Admitting: Physical Therapy

## 2012-04-11 ENCOUNTER — Inpatient Hospital Stay (HOSPITAL_COMMUNITY): Admission: RE | Admit: 2012-04-11 | Payer: Self-pay | Source: Ambulatory Visit | Admitting: *Deleted

## 2012-04-18 ENCOUNTER — Ambulatory Visit (HOSPITAL_COMMUNITY)
Admission: RE | Admit: 2012-04-18 | Discharge: 2012-04-18 | Disposition: A | Discharge status: Home / Self Care | Payer: Self-pay | Source: Ambulatory Visit | Attending: Physical Medicine & Rehabilitation | Admitting: Physical Medicine & Rehabilitation
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
## 2012-05-02 ENCOUNTER — Inpatient Hospital Stay (HOSPITAL_COMMUNITY): Admission: RE | Admit: 2012-05-02 | Payer: Self-pay | Source: Ambulatory Visit | Admitting: Physical Therapy

## 2012-05-03 ENCOUNTER — Inpatient Hospital Stay (HOSPITAL_COMMUNITY): Admission: RE | Admit: 2012-05-03 | Payer: Self-pay | Source: Ambulatory Visit | Admitting: Physical Therapy

## 2012-05-07 ENCOUNTER — Ambulatory Visit (HOSPITAL_COMMUNITY)
Admission: RE | Admit: 2012-05-07 | Discharge: 2012-05-07 | Disposition: A | Discharge status: Home / Self Care | Payer: Self-pay | Source: Ambulatory Visit | Attending: Physical Medicine & Rehabilitation | Admitting: Physical Medicine & Rehabilitation

## 2012-05-07 DIAGNOSIS — M6281 Muscle weakness (generalized): Secondary | ICD-10-CM | POA: Insufficient documentation

## 2012-05-07 DIAGNOSIS — R262 Difficulty in walking, not elsewhere classified: Secondary | ICD-10-CM | POA: Insufficient documentation

## 2012-05-07 DIAGNOSIS — M25579 Pain in unspecified ankle and joints of unspecified foot: Secondary | ICD-10-CM | POA: Insufficient documentation

## 2012-05-07 DIAGNOSIS — M25559 Pain in unspecified hip: Secondary | ICD-10-CM | POA: Insufficient documentation

## 2012-05-07 DIAGNOSIS — IMO0001 Reserved for inherently not codable concepts without codable children: Secondary | ICD-10-CM | POA: Insufficient documentation
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
## 2012-05-14 ENCOUNTER — Ambulatory Visit (HOSPITAL_COMMUNITY)
Admission: RE | Admit: 2012-05-14 | Discharge: 2012-05-14 | Disposition: A | Discharge status: Home / Self Care | Payer: Self-pay | Source: Ambulatory Visit | Attending: Physical Medicine & Rehabilitation | Admitting: Physical Medicine & Rehabilitation
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
## 2012-05-21 ENCOUNTER — Ambulatory Visit (HOSPITAL_COMMUNITY): Payer: Self-pay | Admitting: *Deleted

## 2012-05-24 ENCOUNTER — Ambulatory Visit (HOSPITAL_COMMUNITY)
Admission: RE | Admit: 2012-05-24 | Discharge: 2012-05-24 | Disposition: A | Discharge status: Home / Self Care | Payer: Self-pay | Source: Ambulatory Visit | Attending: Physical Medicine & Rehabilitation | Admitting: Physical Medicine & Rehabilitation
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
## 2012-07-01 IMAGING — CT CT ABD-PELV W/ CM
2 of 9 series · 14 of 46 positions shown, 19 images · IV contrast (APPLIED)
Comparison: 08/16/2011

CLINICAL DATA: Increased abdominal and pelvic pain.  Leukocytosis.
Open pelvic fracture.

CT ABDOMEN AND PELVIS WITH CONTRAST
TECHNIQUE: Multidetector CT imaging of the abdomen and pelvis was
performed following the standard protocol during bolus
administration of intravenous contrast.
Contrast: 75mL OMNIPAQUE IOHEXOL 300 MG/ML  SOLN

[Series 2: abd/pelv with 5.0 b31f st · axial · 0.62mm/px · z∈[+947,+1332]mm · 11 of 93 slices shown, 16 images]
[im 8/93  soft-tissue]
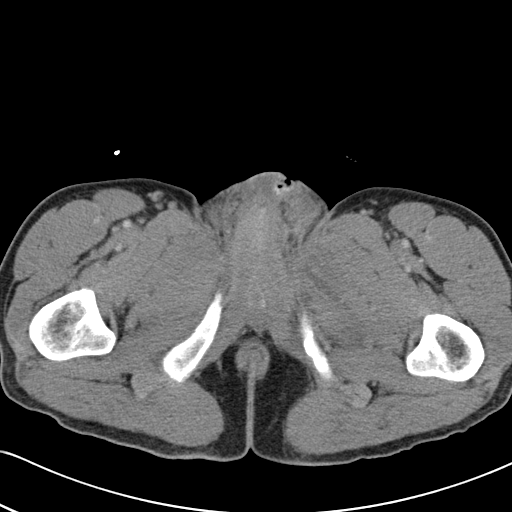
[im 8/93  bone]
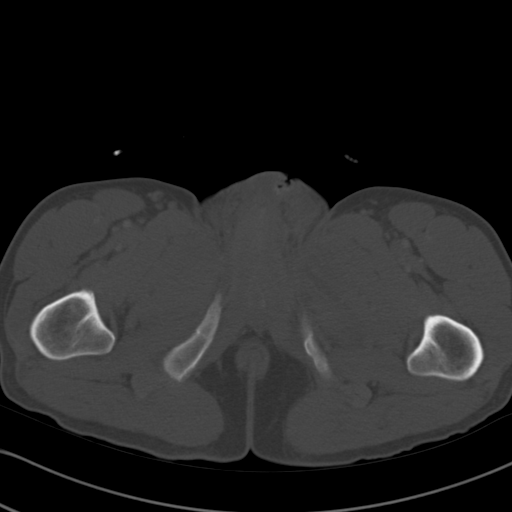
[im 15/93  soft-tissue]
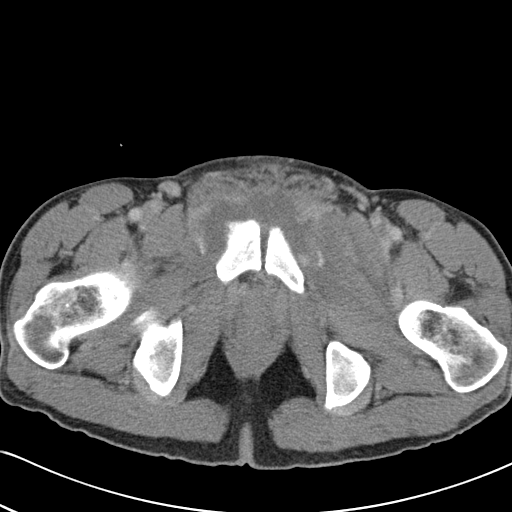
[im 29/93  soft-tissue]
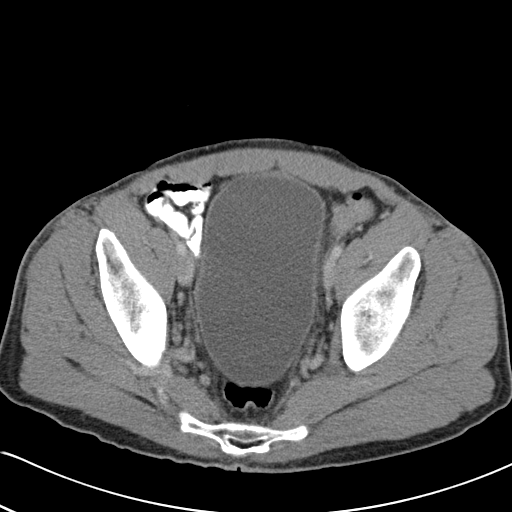
[im 36/93  soft-tissue]
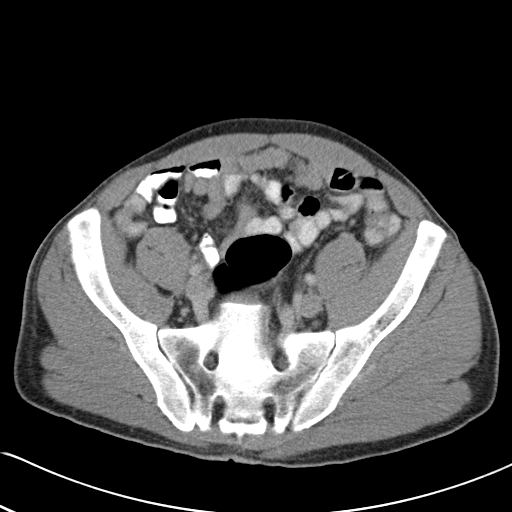
[im 43/93  soft-tissue]
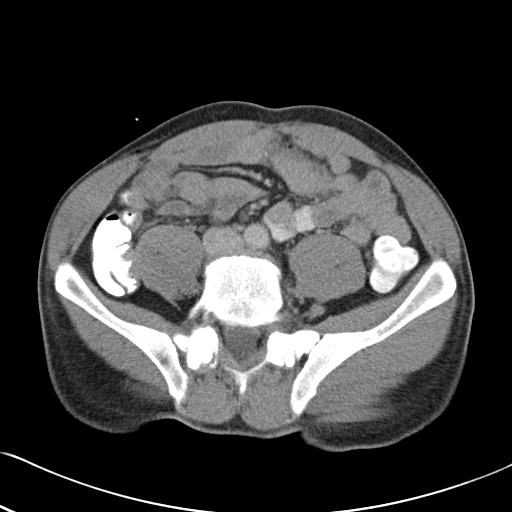
[im 50/93  soft-tissue]
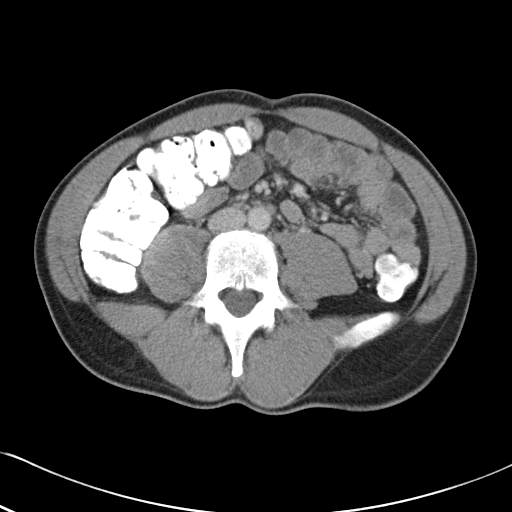
[im 57/93  soft-tissue]
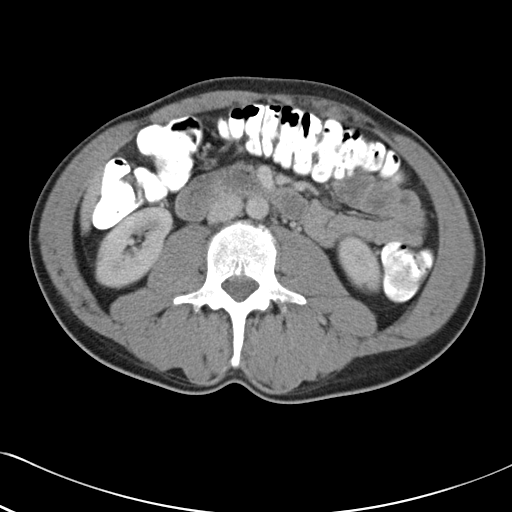
[im 64/93  lung]
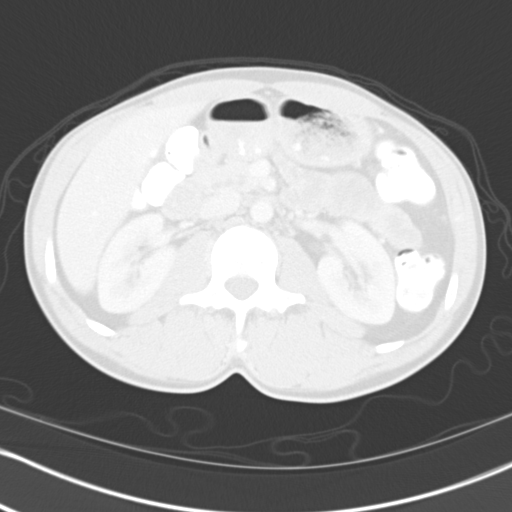
[im 71/93  soft-tissue]
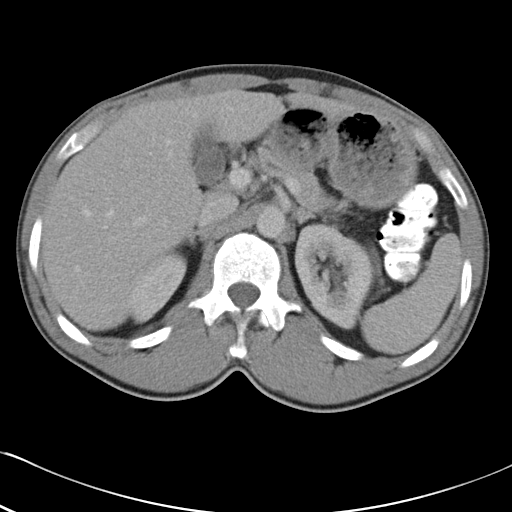
[im 71/93  lung]
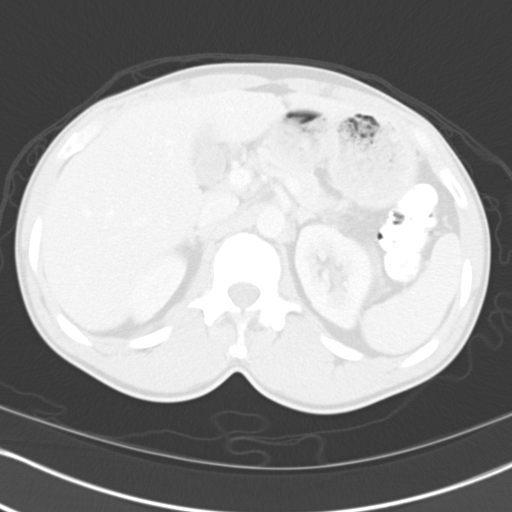
[im 78/93  soft-tissue]
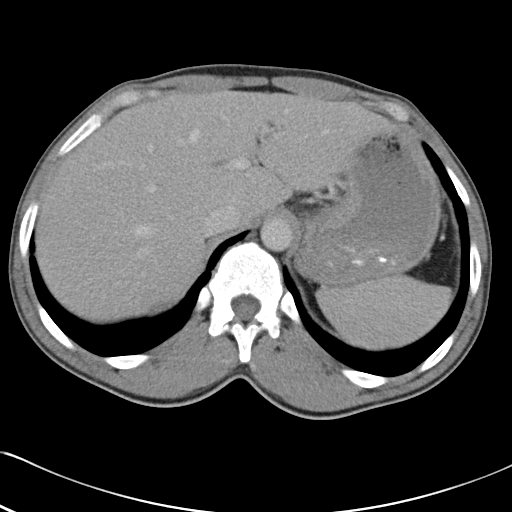
[im 78/93  lung]
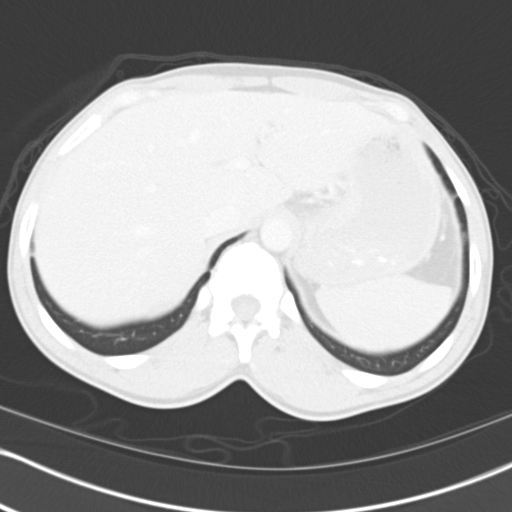
[im 78/93  bone]
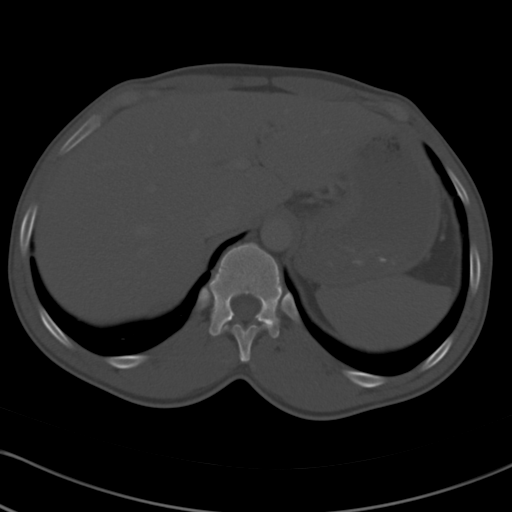
[im 85/93  soft-tissue]
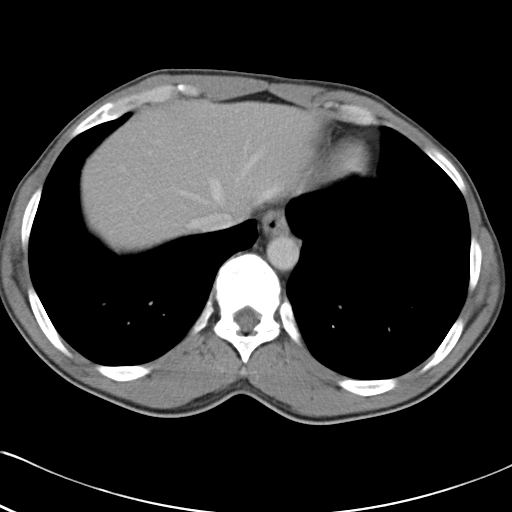
[im 85/93  lung]
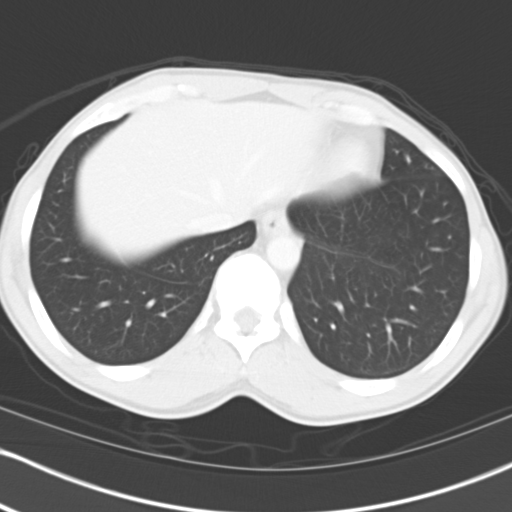

[Series 602: coronals · coronal · 0.91mm/px · 3 of 67 slices shown]
[im 17/67  soft-tissue]
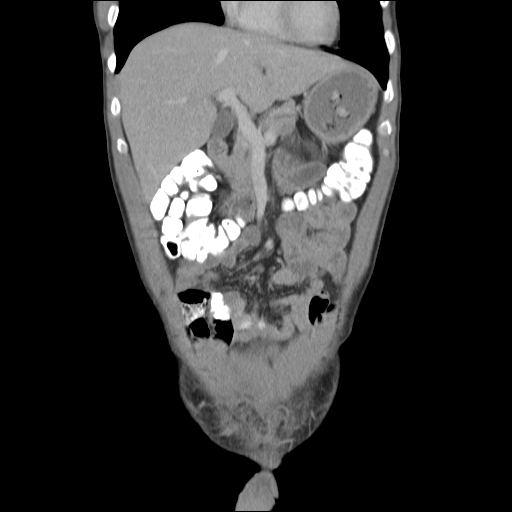
[im 34/67  soft-tissue]
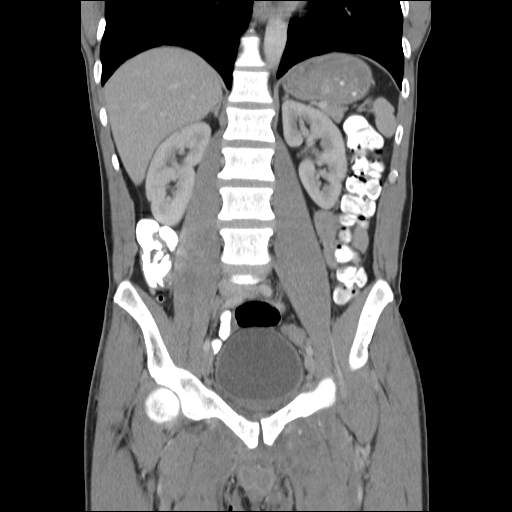
[im 50/67  soft-tissue]
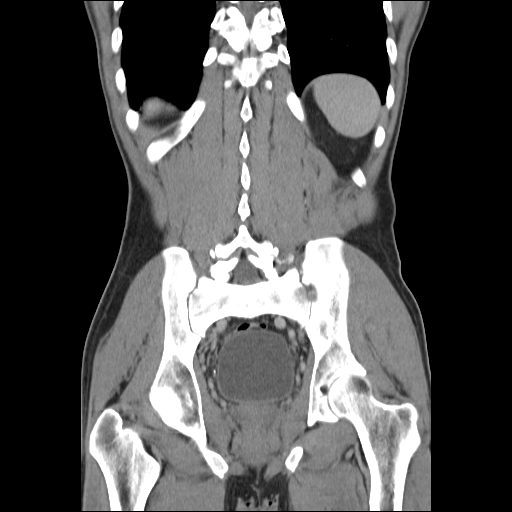

[14 of 46 positions shown; findings below may reference images not displayed]

FINDINGS: A 3 mm nonobstructing calculus is again seen in the mid
pole of the left kidney.  No evidence of renal mass or
hydronephrosis.  The other abdominal parenchymal organs are normal
in appearance.  Gallbladder is unremarkable.  No soft tissue masses
or lymphadenopathy identified within the abdomen or pelvis.

Bilateral superior and inferior pubic rami fractures are again
demonstrated.  A fluid collection is now seen in the prepubic and
suprapubic abdominal wall soft tissues near the fracture sites
which measures approximately 3 x 6 cm, and this contains a few tiny
internal air bubbles tracking to the perineal skin surface just to
the left side of the base of the penis.  This could represent
abscess or liquefying hematoma.

No intraperitoneal or retroperitoneal hemorrhage is identified.  No
evidence of bowel wall thickening or dilatation.  Urinary bladder
is unremarkable in appearance.
IMPRESSION: 1.  3 x 6 cm low attenuation fluid collection in the prepubic and
suprapubic abdominal wall soft tissues adjacent to the bilateral
pubic fractures sites.  This may represent abscess or liquefying
hematoma.
2.  Stable nondisplaced bilateral superior and inferior pubic rami
fractures.
3.  No evidence of retroperitoneal hemorrhage or hemoperitoneum.
No evidence of visceral injury.

## 2014-01-26 ENCOUNTER — Encounter (HOSPITAL_COMMUNITY): Payer: Self-pay | Admitting: Emergency Medicine

## 2014-01-26 ENCOUNTER — Emergency Department (HOSPITAL_COMMUNITY)
Admission: EM | Admit: 2014-01-26 | Discharge: 2014-01-26 | Disposition: A | Discharge status: Home / Self Care | Payer: Medicaid - Out of State | Attending: Emergency Medicine | Admitting: Emergency Medicine

## 2014-01-26 DIAGNOSIS — M5441 Lumbago with sciatica, right side: Secondary | ICD-10-CM | POA: Diagnosis not present

## 2014-01-26 DIAGNOSIS — Z79899 Other long term (current) drug therapy: Secondary | ICD-10-CM | POA: Diagnosis not present

## 2014-01-26 DIAGNOSIS — Z720 Tobacco use: Secondary | ICD-10-CM | POA: Insufficient documentation

## 2014-01-26 DIAGNOSIS — Z8781 Personal history of (healed) traumatic fracture: Secondary | ICD-10-CM | POA: Insufficient documentation

## 2014-01-26 DIAGNOSIS — M25551 Pain in right hip: Secondary | ICD-10-CM | POA: Diagnosis present

## 2014-01-26 MED ORDER — TRAMADOL HCL 50 MG PO TABS
50.0000 mg | ORAL_TABLET | Freq: Four times a day (QID) | ORAL | Status: AC | PRN
Start: 2014-01-26 — End: ?

## 2014-01-26 MED ORDER — DIAZEPAM 5 MG PO TABS
10.0000 mg | ORAL_TABLET | Freq: Once | ORAL | Status: AC
Start: 2014-01-26 — End: 2014-01-26
  Administered 2014-01-26: 10 mg via ORAL
  Filled 2014-01-26: qty 2

## 2014-01-26 MED ORDER — CYCLOBENZAPRINE HCL 10 MG PO TABS
10.0000 mg | ORAL_TABLET | Freq: Two times a day (BID) | ORAL | Status: DC | PRN
Start: 2014-01-26 — End: 2014-04-03

## 2014-01-26 MED ORDER — PREDNISONE 50 MG PO TABS
60.0000 mg | ORAL_TABLET | Freq: Once | ORAL | Status: AC
  Administered 2014-01-26: 60 mg via ORAL
  Filled 2014-01-26 (×2): qty 1

## 2014-01-26 MED ORDER — KETOROLAC TROMETHAMINE 10 MG PO TABS
10.0000 mg | ORAL_TABLET | Freq: Once | ORAL | Status: AC
  Administered 2014-01-26: 10 mg via ORAL
  Filled 2014-01-26: qty 1

## 2014-01-26 MED ORDER — PREDNISONE 50 MG PO TABS: ORAL_TABLET | ORAL | Status: DC

## 2014-01-26 NOTE — ED Notes (Signed)
Pt reports R hip and back pain with radiation down his R leg.

## 2014-01-26 NOTE — Discharge Instructions (Signed)
Medication for pain, muscle spasm, prednisone. Try to follow up with your regular doctor
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
## 2014-01-26 NOTE — ED Notes (Signed)
E sig not working. Pt signed for discharge.

## 2014-04-03 ENCOUNTER — Emergency Department (HOSPITAL_COMMUNITY)
Admission: EM | Admit: 2014-04-03 | Discharge: 2014-04-03 | Disposition: A | Discharge status: Home / Self Care | Payer: Medicaid - Out of State | Attending: Emergency Medicine | Admitting: Emergency Medicine

## 2014-04-03 ENCOUNTER — Encounter (HOSPITAL_COMMUNITY): Payer: Self-pay | Admitting: Emergency Medicine

## 2014-04-03 DIAGNOSIS — Z720 Tobacco use: Secondary | ICD-10-CM | POA: Insufficient documentation

## 2014-04-03 DIAGNOSIS — G8929 Other chronic pain: Secondary | ICD-10-CM | POA: Insufficient documentation

## 2014-04-03 DIAGNOSIS — Z79899 Other long term (current) drug therapy: Secondary | ICD-10-CM | POA: Insufficient documentation

## 2014-04-03 DIAGNOSIS — Z7952 Long term (current) use of systemic steroids: Secondary | ICD-10-CM | POA: Insufficient documentation

## 2014-04-03 DIAGNOSIS — M5432 Sciatica, left side: Secondary | ICD-10-CM | POA: Insufficient documentation

## 2014-04-03 MED ORDER — CYCLOBENZAPRINE HCL 10 MG PO TABS
10.0000 mg | ORAL_TABLET | Freq: Once | ORAL | Status: AC
  Administered 2014-04-03: 10 mg via ORAL
  Filled 2014-04-03: qty 1

## 2014-04-03 MED ORDER — OXYCODONE-ACETAMINOPHEN 5-325 MG PO TABS
1.0000 | ORAL_TABLET | Freq: Once | ORAL | Status: AC
Start: 2014-04-03 — End: 2014-04-03
  Administered 2014-04-03: 1 via ORAL
  Filled 2014-04-03: qty 1

## 2014-04-03 MED ORDER — CYCLOBENZAPRINE HCL 10 MG PO TABS
10.0000 mg | ORAL_TABLET | Freq: Three times a day (TID) | ORAL | Status: AC | PRN
Start: 2014-04-03 — End: ?

## 2014-04-03 MED ORDER — KETOROLAC TROMETHAMINE 60 MG/2ML IM SOLN
60.0000 mg | Freq: Once | INTRAMUSCULAR | Status: AC
  Administered 2014-04-03: 60 mg via INTRAMUSCULAR
  Filled 2014-04-03: qty 2

## 2014-04-03 MED ORDER — PREDNISONE 20 MG PO TABS: ORAL_TABLET | ORAL | Status: AC

## 2014-04-03 NOTE — ED Notes (Signed)
Patient with no complaints at this time. Respirations even and unlabored. Skin warm/dry. Discharge instructions reviewed with patient at this time. Patient given opportunity to voice concerns/ask questions. Patient discharged at this time and left Emergency Department with steady gait.   

## 2014-04-03 NOTE — ED Notes (Signed)
Pt reports chronic lower back pain flare-up last night. nad noted.

## 2014-04-03 NOTE — Discharge Instructions (Signed)
Sciatica °Sciatica is pain, weakness, numbness, or tingling along your sciatic nerve. The nerve starts in the lower back and runs down the back of each leg. Nerve damage or certain conditions pinch or put pressure on the sciatic nerve. This causes the pain, weakness, and other discomforts of sciatica. °HOME CARE  °· Only take medicine as told by your doctor. °· Apply ice to the affected area for 20 minutes. Do this 3-4 times a day for the first 48-72 hours. Then try heat in the same way. °· Exercise, stretch, or do your usual activities if these do not make your pain worse. °· Go to physical therapy as told by your doctor. °· Keep all doctor visits as told. °· Do not wear high heels or shoes that are not supportive. °· Get a firm mattress if your mattress is too soft to lessen pain and discomfort. °GET HELP RIGHT AWAY IF:  °· You cannot control when you poop (bowel movement) or pee (urinate). °· You have more weakness in your lower back, lower belly (pelvis), butt (buttocks), or legs. °· You have redness or puffiness (swelling) of your back. °· You have a burning feeling when you pee. °· You have pain that gets worse when you lie down. °· You have pain that wakes you from your sleep. °· Your pain is worse than past pain. °· Your pain lasts longer than 4 weeks. °· You are suddenly losing weight without reason. °MAKE SURE YOU:  °· Understand these instructions. °· Will watch this condition. °· Will get help right away if you are not doing well or get worse. °Document Released: 12/21/2007 Document Revised: 09/12/2011 Document Reviewed: 07/23/2011 °Exit[employer]® Patient Information ©2015 [employer]. This information is not intended to replace advice given to you by your health care provider. Make sure you discuss any questions you have with your health care provider. ° °
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
